# Patient Record
Sex: Female | Born: 1970 | Race: Black or African American | Hispanic: No | Marital: Married | Smoking: Former smoker
Health system: Southern US, Community
[De-identification: ages and names within clinical notes are randomized; demographics above are authoritative.]

## PROBLEM LIST (undated history)

## (undated) DIAGNOSIS — I10 Essential (primary) hypertension: Secondary | ICD-10-CM

## (undated) DIAGNOSIS — G43909 Migraine, unspecified, not intractable, without status migrainosus: Secondary | ICD-10-CM

## (undated) HISTORY — DX: Migraine, unspecified, not intractable, without status migrainosus: G43.909

## (undated) HISTORY — PX: TONSILLECTOMY: SUR1361

## (undated) HISTORY — PX: TUBAL LIGATION: SHX77

---

## 2000-03-04 ENCOUNTER — Emergency Department (HOSPITAL_COMMUNITY): Admission: EM | Admit: 2000-03-04 | Discharge: 2000-03-04 | Payer: Self-pay | Admitting: Emergency Medicine

## 2003-04-22 ENCOUNTER — Emergency Department (HOSPITAL_COMMUNITY): Admission: EM | Admit: 2003-04-22 | Discharge: 2003-04-22 | Payer: Self-pay | Admitting: Emergency Medicine

## 2003-05-28 ENCOUNTER — Ambulatory Visit (HOSPITAL_COMMUNITY): Admission: RE | Admit: 2003-05-28 | Discharge: 2003-05-28 | Payer: Self-pay | Admitting: Obstetrics & Gynecology

## 2003-06-13 ENCOUNTER — Emergency Department (HOSPITAL_COMMUNITY): Admission: EM | Admit: 2003-06-13 | Discharge: 2003-06-14 | Payer: Self-pay | Admitting: Podiatry

## 2003-07-17 ENCOUNTER — Ambulatory Visit (HOSPITAL_COMMUNITY): Admission: RE | Admit: 2003-07-17 | Discharge: 2003-07-17 | Payer: Self-pay | Admitting: Obstetrics & Gynecology

## 2003-09-26 ENCOUNTER — Ambulatory Visit (HOSPITAL_COMMUNITY): Admission: RE | Admit: 2003-09-26 | Discharge: 2003-09-26 | Payer: Self-pay | Admitting: Obstetrics & Gynecology

## 2003-10-01 ENCOUNTER — Inpatient Hospital Stay (HOSPITAL_COMMUNITY): Admission: RE | Admit: 2003-10-01 | Discharge: 2003-10-06 | Payer: Self-pay | Admitting: Obstetrics

## 2003-10-02 ENCOUNTER — Encounter (INDEPENDENT_AMBULATORY_CARE_PROVIDER_SITE_OTHER): Payer: Self-pay | Admitting: Specialist

## 2004-10-09 ENCOUNTER — Emergency Department (HOSPITAL_COMMUNITY): Admission: EM | Admit: 2004-10-09 | Discharge: 2004-10-09 | Payer: Self-pay | Admitting: Family Medicine

## 2005-07-17 ENCOUNTER — Emergency Department (HOSPITAL_COMMUNITY): Admission: EM | Admit: 2005-07-17 | Discharge: 2005-07-17 | Payer: Self-pay | Admitting: Emergency Medicine

## 2005-07-29 ENCOUNTER — Emergency Department (HOSPITAL_COMMUNITY): Admission: EM | Admit: 2005-07-29 | Discharge: 2005-07-29 | Payer: Self-pay | Admitting: Emergency Medicine

## 2005-10-24 ENCOUNTER — Emergency Department (HOSPITAL_COMMUNITY): Admission: EM | Admit: 2005-10-24 | Discharge: 2005-10-24 | Payer: Self-pay | Admitting: *Deleted

## 2006-05-23 ENCOUNTER — Emergency Department (HOSPITAL_COMMUNITY): Admission: EM | Admit: 2006-05-23 | Discharge: 2006-05-23 | Payer: Self-pay | Admitting: Emergency Medicine

## 2008-07-20 ENCOUNTER — Emergency Department (HOSPITAL_COMMUNITY): Admission: EM | Admit: 2008-07-20 | Discharge: 2008-07-20 | Payer: Self-pay | Admitting: Family Medicine

## 2008-08-24 ENCOUNTER — Ambulatory Visit: Payer: Self-pay | Admitting: Family Medicine

## 2008-08-24 LAB — CONVERTED CEMR LAB: Microalb, Ur: 4.4 mg/dL — ABNORMAL HIGH (ref 0.00–1.89)

## 2008-10-08 ENCOUNTER — Ambulatory Visit: Payer: Self-pay | Admitting: Family Medicine

## 2008-11-08 ENCOUNTER — Ambulatory Visit: Payer: Self-pay | Admitting: Family Medicine

## 2008-11-08 LAB — CONVERTED CEMR LAB
ALT: 15 units/L (ref 0–35)
AST: 23 units/L (ref 0–37)
Albumin: 4.1 g/dL (ref 3.5–5.2)
Alkaline Phosphatase: 45 units/L (ref 39–117)
BUN: 13 mg/dL (ref 6–23)
Basophils Absolute: 0 10*3/uL (ref 0.0–0.1)
Basophils Relative: 1 % (ref 0–1)
CO2: 25 meq/L (ref 19–32)
Calcium: 9.5 mg/dL (ref 8.4–10.5)
Chloride: 103 meq/L (ref 96–112)
Cholesterol: 153 mg/dL (ref 0–200)
Creatinine, Ser: 0.87 mg/dL (ref 0.40–1.20)
Eosinophils Absolute: 0.2 10*3/uL (ref 0.0–0.7)
Eosinophils Relative: 5 % (ref 0–5)
Glucose, Bld: 87 mg/dL (ref 70–99)
HCT: 36.3 % (ref 36.0–46.0)
HDL: 50 mg/dL (ref >39)
Hemoglobin: 11.7 g/dL — ABNORMAL LOW (ref 12.0–15.0)
LDL Cholesterol: 88 mg/dL (ref 0–99)
Lymphocytes Relative: 41 % (ref 12–46)
Lymphs Abs: 1.6 10*3/uL (ref 0.7–4.0)
MCHC: 32.2 g/dL (ref 30.0–36.0)
MCV: 88.8 fL (ref 78.0–100.0)
Monocytes Absolute: 0.5 10*3/uL (ref 0.1–1.0)
Monocytes Relative: 13 % — ABNORMAL HIGH (ref 3–12)
Neutro Abs: 1.6 10*3/uL — ABNORMAL LOW (ref 1.7–7.7)
Neutrophils Relative %: 40 % — ABNORMAL LOW (ref 43–77)
Platelets: 286 10*3/uL (ref 150–400)
Potassium: 3.8 meq/L (ref 3.5–5.3)
RBC: 4.09 M/uL (ref 3.87–5.11)
RDW: 14.4 % (ref 11.5–15.5)
Sodium: 138 meq/L (ref 135–145)
TSH: 1.252 microintl units/mL (ref 0.350–4.500)
Total Bilirubin: 0.3 mg/dL (ref 0.3–1.2)
Total CHOL/HDL Ratio: 3.1
Total Protein: 6.8 g/dL (ref 6.0–8.3)
Triglycerides: 76 mg/dL (ref <150)
VLDL: 15 mg/dL (ref 0–40)
WBC: 3.9 10*3/uL — ABNORMAL LOW (ref 4.0–10.5)

## 2008-11-23 ENCOUNTER — Ambulatory Visit: Payer: Self-pay | Admitting: Family Medicine

## 2008-11-23 ENCOUNTER — Encounter: Payer: Self-pay | Admitting: Family Medicine

## 2008-11-23 ENCOUNTER — Other Ambulatory Visit: Admission: RE | Admit: 2008-11-23 | Discharge: 2008-11-23 | Payer: Self-pay | Admitting: Family Medicine

## 2008-11-23 LAB — CONVERTED CEMR LAB
Chlamydia, DNA Probe: NEGATIVE
GC Probe Amp, Genital: NEGATIVE

## 2009-03-13 ENCOUNTER — Ambulatory Visit: Payer: Self-pay | Admitting: Internal Medicine

## 2009-09-13 ENCOUNTER — Ambulatory Visit: Payer: Self-pay | Admitting: Internal Medicine

## 2009-10-07 ENCOUNTER — Ambulatory Visit: Payer: Self-pay | Admitting: Internal Medicine

## 2009-10-07 LAB — CONVERTED CEMR LAB
BUN: 13 mg/dL (ref 6–23)
CO2: 23 meq/L (ref 19–32)
Calcium: 9 mg/dL (ref 8.4–10.5)
Chloride: 107 meq/L (ref 96–112)
Cholesterol: 148 mg/dL (ref 0–200)
Creatinine, Ser: 0.73 mg/dL (ref 0.40–1.20)
Glucose, Bld: 83 mg/dL (ref 70–99)
HDL: 65 mg/dL (ref >39)
LDL Cholesterol: 74 mg/dL (ref 0–99)
Potassium: 3.8 meq/L (ref 3.5–5.3)
Sodium: 140 meq/L (ref 135–145)
Total CHOL/HDL Ratio: 2.3
Triglycerides: 47 mg/dL (ref <150)
VLDL: 9 mg/dL (ref 0–40)

## 2009-11-12 ENCOUNTER — Ambulatory Visit: Payer: Self-pay | Admitting: Internal Medicine

## 2009-11-13 ENCOUNTER — Ambulatory Visit: Payer: Self-pay | Admitting: Internal Medicine

## 2009-11-27 ENCOUNTER — Ambulatory Visit: Payer: Self-pay | Admitting: Internal Medicine

## 2009-11-27 LAB — CONVERTED CEMR LAB
BUN: 14 mg/dL (ref 6–23)
CO2: 22 meq/L (ref 19–32)
Calcium: 9.7 mg/dL (ref 8.4–10.5)
Chloride: 105 meq/L (ref 96–112)
Creatinine, Ser: 0.86 mg/dL (ref 0.40–1.20)
Glucose, Bld: 88 mg/dL (ref 70–99)
Potassium: 4 meq/L (ref 3.5–5.3)
Sodium: 139 meq/L (ref 135–145)

## 2010-03-13 ENCOUNTER — Emergency Department (HOSPITAL_COMMUNITY): Admission: EM | Admit: 2010-03-13 | Discharge: 2010-03-13 | Payer: Self-pay | Admitting: Emergency Medicine

## 2010-07-08 LAB — POCT I-STAT, CHEM 8
BUN: 10 mg/dL (ref 6–23)
Calcium, Ion: 1.11 mmol/L — ABNORMAL LOW (ref 1.12–1.32)
Chloride: 106 mEq/L (ref 96–112)
Creatinine, Ser: 0.8 mg/dL (ref 0.4–1.2)
Glucose, Bld: 79 mg/dL (ref 70–99)
HCT: 37 % (ref 36.0–46.0)
Hemoglobin: 12.6 g/dL (ref 12.0–15.0)
Potassium: 3.6 mEq/L (ref 3.5–5.1)
Sodium: 138 mEq/L (ref 135–145)
TCO2: 23 mmol/L (ref 0–100)

## 2010-07-08 LAB — HEMOCCULT GUIAC POC 1CARD (OFFICE): Fecal Occult Bld: NEGATIVE

## 2010-08-07 LAB — POCT I-STAT, CHEM 8
BUN: 11 mg/dL (ref 6–23)
Calcium, Ion: 1.23 mmol/L (ref 1.12–1.32)
Chloride: 102 mEq/L (ref 96–112)
Creatinine, Ser: 1 mg/dL (ref 0.4–1.2)
Glucose, Bld: 88 mg/dL (ref 70–99)
HCT: 41 % (ref 36.0–46.0)
Hemoglobin: 13.9 g/dL (ref 12.0–15.0)
Potassium: 3.6 mEq/L (ref 3.5–5.1)
Sodium: 140 mEq/L (ref 135–145)
TCO2: 29 mmol/L (ref 0–100)

## 2010-09-12 NOTE — Op Note (Signed)
Hampton, KAHLER                     ACCOUNT NO.:  [id number]   MEDICAL RECORD NO.:  [id number]                   PATIENT TYPE:  INP   LOCATION:  9140                                 FACILITY:  [facility]   PHYSICIAN:  Charles A. Clearance Coots, M.D.             DATE OF BIRTH:  January 22, 1971   DATE OF PROCEDURE:  10/03/2003  DATE OF DISCHARGE:                                 OPERATIVE REPORT   PREOPERATIVE DIAGNOSES:  1. Arrest of descent.  2. Persistent occiput posterior position.  3. Desires sterilization.   POSTOPERATIVE DIAGNOSES:  1. Arrest of descent.  2. Persistent occiput posterior position.  3. Desires sterilization.   PROCEDURES:  1. Primary low transverse cesarean section.  2. Bilateral partial salpingectomy (Pomeroy technique).   SURGEON:  Charles A. Clearance Coots, M.D.   ASSISTANT:  Rochele Pages, certified surgical technician.   ANESTHESIA:  Epidural.   ESTIMATED BLOOD LOSS:  1000 mL.   FLUIDS REPLACED:  2800 mL.   URINE OUTPUT:  275 mL.   COMPLICATIONS:  None.   Foley to gravity.   FINDINGS:  A viable female at 2330, Apgars of 9 at one minute, 9 at five  minutes.  Weight of 7 pounds 14 ounces.  Normal uterus, ovaries, and  fallopian tubes.   SPECIMENS:  Approximately 2 cm segments of right and left fallopian tube.   OPERATION:  The patient was brought to the operating room after satisfactory  epidural redosing.  The abdomen was prepped and draped in the usual sterile  fashion.  A Pfannenstiel skin incision was made with the scalpel that was  deepened down to the fascia with a scalpel.  The fascia was nicked in the  midline and the fascial incision was extended to the left and to the right.  The superior and inferior fascial edges were taken off of the rectus muscle  with both blunt and sharp dissection.  The rectus muscle was sharply divided  in the midline superiorly and inferiorly.  The peritoneum was entered  digitally and was digitally extended to the left,  then to the right.  The  bladder blade was positioned.  The vesicouterine fold of peritoneum above  the reflection of the urinary bladder was grasped with a forceps and was  incised and undermined with Metzenbaum scissors.  The incision was extended  to the left and to the right with the Metzenbaum scissors.  The bladder flap  was bluntly developed and the bladder blade was repositioned in front of the  urinary bladder, placing it well out of the operative field.  The uterus was  then entered transversely with a scalpel in the lower uterine segment.  Clear fluid was expelled.  The uterine incision was then extended to the  left and to the right digitally.  The vertex was noted to be left occiput  posterior.  The occiput was rotated into the incision and the vertex was  then delivered with the aid of  fundal pressure from the assistant.  The  infant's mouth and nose were suctioned with a suction bulb and the delivery  was completed with the aid of fundal pressure from the assistant.  The  umbilical cord was doubly clamped and cut and the infant was handed off to  the nursery staff.  The placenta was spontaneously expelled from the uterine  cavity intact after cord blood was obtained.  The uterus was exteriorized  and the endometrial surface was thoroughly debrided with a dry lap sponge.  The edges of the uterine incision were grasped with ring forceps and the  uterus was closed with a continuous interlocking suture of 0 Monocryl from  each corner to the center.  Hemostasis was excellent.  Attention was then  turned above to the tubal ligation procedure.  The left fallopian tube was  grasped with the Babcock clamp in the isthmic area of the tube.  The tube  was identified from the cornual end to the fimbrial end and grasped with the  Babcock clamp.  The knuckle of tube beneath the Babcock clamp was then  doubly ligated with #1 plain catgut and the section of the tube above the  knot was  excised with Metzenbaum scissors and submitted to pathology for  evaluation.  The same procedure was performed on the opposite side without  complications.  The uterus was then placed back in its normal anatomic  position.  The pelvic cavity was thoroughly irrigated with warm saline  solution.  Abdomen was then closed as follows:  Peritoneum grasped with  Kelly forceps and was closed with a continuous suture of 2-0 Monocryl, the  fascia closed with continuous suture of 0 PDS from each corner to the  center, subcutaneous tissue was thoroughly irrigated with warm saline  solution.  All areas of subcutaneous bleeding were coagulated with the  Bovie.  The skin was then closed with a continuous subcuticular suture of 4-  0 Monocryl.  Sterile bandage was applied to the incision closure.  Surgical  technician indicated that all needle, sponge, and instrument counts were  correct.  The patient tolerated the procedure well and was transported to  the recovery room in satisfactory condition.                                               Charles A. Clearance Coots, M.D.    CAH/MEDQ  D:  10/03/2003  T:  10/03/2003  Job:  [id number]

## 2010-09-12 NOTE — Discharge Summary (Signed)
Jane Hampton, Jane Hampton                     ACCOUNT NO.:  [id number]   MEDICAL RECORD NO.:  [id number]                   PATIENT TYPE:  INP   LOCATION:  9140                                 FACILITY:  [facility]   PHYSICIAN:  Charles A. Clearance Coots, M.D.             DATE OF BIRTH:  1970/11/28   DATE OF ADMISSION:  10/01/2003  DATE OF DISCHARGE:  10/06/2003                                 DISCHARGE SUMMARY   ADMITTING DIAGNOSES:  1. Postdates pregnancy.  2. Borderline oligohydramnios.  3. Induction of labor.  4. Desired permanent sterilization.   DISCHARGE DIAGNOSES:  1. Postdates pregnancy.  2. Borderline oligohydramnios.  3. Induction of labor.  4. Desired permanent sterilization.  5. Status post primary low transverse cesarean section and bilateral partial     salpingectomy on October 03, 2003 for arrest of descent and persistent     occiput posterior position.  Delivered a viable female at 2330; Apgars of 9     at one minute and 9 at five minutes; a weight of 7 pounds 14 ounces,     length of 51 cm. Mother and infant discharged home in good condition.   REASON FOR ADMISSION:  A 40 year old female G3 P2 with estimated date of  confinement of Sep 25, 2003 seen in the office with a nonreactive nonstress  test.  Sent to [facility] for biophysical profile which revealed an  ultrasound biophysical profile of 8/8 but the amniotic fluid index was 7.9  cm.  The patient was therefore admitted for induction of labor for postdates  pregnancy with borderline oligohydramnios and nonreactive nonstress test.   PAST MEDICAL HISTORY:  Surgery:  None.  Illnesses:  None.   MEDICATIONS:  Prenatal vitamins.   ALLERGIES:  No known drug allergies.   SOCIAL HISTORY:  Single.  Negative tobacco, alcohol, or recreational drug  use.   PHYSICAL EXAMINATION:  VITAL SIGNS:  Afebrile, vital signs are stable.  LUNGS:  Clear to auscultation bilaterally.  HEART:  Regular rate and rhythm.  ABDOMEN:  Gravid,  nontender.  PELVIC:  Cervix long, closed, and vertex at a -3 station.   ADMITTING LABORATORY VALUES:  Hemoglobin 11.5; hematocrit 33.9; white blood  cell count 7700; platelets 214,000.   HOSPITAL COURSE:  The patient was admitted and started on Pitocin induction  of labor.  The patient progressed to 2 cm, 90% effacement, and the vertex  was at -1 station after starting the Pitocin the following morning after  cervical ripening the night before.  The patient progressed to 4-5 cm  dilatation, 100% effacement, and the vertex was at a -1 station after about  6 hours of Pitocin.  Membranes were ruptured at that point and intrauterine  pressure catheter and internal fetal scalp electrode were applied.  The  patient then progressed over the next 4 hours to 6 cm dilatation, 100%  effacement, and the vertex was at a -1 station with increased molding and  the  vertex was thought to be occiput posterior.  She made no change in her  cervical exam over the next 3 hours and after greater than 3 hours of no  cervical change and no descent of the vertex with adequate forces of labor  as indicated by the intrauterine pressure catheter [place] units a  decision was made to proceed for cesarean section delivery for arrest of the  first stage of labor; specifically, arrest of descent and persistent occiput  posterior position.  The patient desired permanent sterilization and this  was agreed to.  She underwent a primary low transverse cesarean section and  bilateral partial salpingectomy on October 03, 2003 and delivered a viable female  at 2330.  There were no intraoperative complications.  The postoperative  course was uncomplicated.  The patient was discharged home on postoperative  day #3 in good condition.   DISCHARGE LABORATORY VALUES:  Hemoglobin 10.9, hematocrit 31.9.   DISCHARGE DISPOSITION:  1. Medications:  Tylox and ibuprofen were prescribed for pain; the patient     is to continue prenatal  vitamins.  2. Routine written instructions per booklet were given for obstetrical     discharge post cesarean section.  3. The patient is to follow up in the office in 6 weeks.                                               Charles A. Clearance Coots, M.D.    CAH/MEDQ  D:  11/01/2003  T:  11/02/2003  Job:  [id number]

## 2011-01-20 ENCOUNTER — Emergency Department (HOSPITAL_COMMUNITY)
Admission: EM | Admit: 2011-01-20 | Discharge: 2011-01-21 | Disposition: A | Discharge status: Home / Self Care | Payer: Medicaid Other | Attending: Emergency Medicine | Admitting: Emergency Medicine

## 2011-01-20 DIAGNOSIS — I10 Essential (primary) hypertension: Secondary | ICD-10-CM | POA: Insufficient documentation

## 2011-01-20 DIAGNOSIS — IMO0002 Reserved for concepts with insufficient information to code with codable children: Secondary | ICD-10-CM | POA: Insufficient documentation

## 2011-05-18 ENCOUNTER — Emergency Department (HOSPITAL_COMMUNITY)
Admission: EM | Admit: 2011-05-18 | Discharge: 2011-05-19 | Disposition: A | Discharge status: Home / Self Care | Payer: Medicaid Other | Attending: Emergency Medicine | Admitting: Emergency Medicine

## 2011-05-18 ENCOUNTER — Encounter (HOSPITAL_COMMUNITY): Payer: Self-pay | Admitting: Emergency Medicine

## 2011-05-18 DIAGNOSIS — N898 Other specified noninflammatory disorders of vagina: Secondary | ICD-10-CM | POA: Insufficient documentation

## 2011-05-18 DIAGNOSIS — R109 Unspecified abdominal pain: Secondary | ICD-10-CM | POA: Insufficient documentation

## 2011-05-18 DIAGNOSIS — I10 Essential (primary) hypertension: Secondary | ICD-10-CM | POA: Insufficient documentation

## 2011-05-18 DIAGNOSIS — R319 Hematuria, unspecified: Secondary | ICD-10-CM | POA: Insufficient documentation

## 2011-05-18 DIAGNOSIS — Z79899 Other long term (current) drug therapy: Secondary | ICD-10-CM | POA: Insufficient documentation

## 2011-05-18 DIAGNOSIS — N939 Abnormal uterine and vaginal bleeding, unspecified: Secondary | ICD-10-CM

## 2011-05-18 HISTORY — DX: Essential (primary) hypertension: I10

## 2011-05-18 LAB — URINE MICROSCOPIC-ADD ON

## 2011-05-18 LAB — URINE CULTURE
Colony Count: 15000
Culture  Setup Time: 201301220024

## 2011-05-18 LAB — CBC
HCT: 33.8 % — ABNORMAL LOW (ref 36.0–46.0)
Hemoglobin: 11.6 g/dL — ABNORMAL LOW (ref 12.0–15.0)
MCH: 28.2 pg (ref 26.0–34.0)
MCHC: 34.3 g/dL (ref 30.0–36.0)
MCV: 82 fL (ref 78.0–100.0)
Platelets: 287 10*3/uL (ref 150–400)
RBC: 4.12 MIL/uL (ref 3.87–5.11)
RDW: 14.2 % (ref 11.5–15.5)
WBC: 7.3 10*3/uL (ref 4.0–10.5)

## 2011-05-18 LAB — URINALYSIS, ROUTINE W REFLEX MICROSCOPIC
Ketones, ur: NEGATIVE mg/dL
Leukocytes, UA: NEGATIVE
Nitrite: NEGATIVE
Protein, ur: NEGATIVE mg/dL
Specific Gravity, Urine: 1.012 (ref 1.005–1.030)
Urobilinogen, UA: 0.2 mg/dL (ref 0.0–1.0)

## 2011-05-18 LAB — DIFFERENTIAL
Eosinophils Absolute: 0.3 10*3/uL (ref 0.0–0.7)
Lymphs Abs: 2.6 10*3/uL (ref 0.7–4.0)
Monocytes Relative: 9 % (ref 3–12)
Neutrophils Relative %: 49 % (ref 43–77)

## 2011-05-18 MED ORDER — ACETAMINOPHEN 325 MG PO TABS
650.0000 mg | ORAL_TABLET | Freq: Once | ORAL | Status: AC
  Administered 2011-05-18: 650 mg via ORAL
  Filled 2011-05-18: qty 2

## 2011-05-18 NOTE — ED Notes (Signed)
Pt c/o lower abd and lower back pain for approx 4 days.  Also c/o hematuria and vag. discharge

## 2011-05-18 NOTE — ED Provider Notes (Signed)
History     CSN: [id number]  Arrival date & time 05/18/11  1843   First MD Initiated Contact with Patient 05/18/11 2305      Chief Complaint  Patient presents with  . Abdominal Pain    (Consider location/radiation/quality/duration/timing/severity/associated sxs/prior treatment) Patient is a 41 y.o. female presenting with abdominal pain. The history is provided by the patient.  Abdominal Pain The primary symptoms of the illness include abdominal pain and vaginal discharge. The primary symptoms of the illness do not include fever, fatigue, shortness of breath, nausea, vomiting, diarrhea or vaginal bleeding. The current episode started more than 2 days ago. The onset of the illness was gradual. The problem has been gradually worsening.  The patient states that she believes she is currently not pregnant. The patient has not had a change in bowel habit. Additional symptoms associated with the illness include hematuria. Symptoms associated with the illness do not include chills, anorexia, heartburn, constipation, urgency or frequency.   Pt states that she began to have lower abd discomfort on Thursday. This is crampy and intermittent in nature. No known aggravating/alleviating factors. Denies nausea, vomiting, change in BM. Has not had any change in appetite. States she also noticed vaginal discharge several days ago which was clear. Denies urinary sx, dyspareunia. Has not noted any vaginal bleeding. LMP 04/28/11; normally has very regular menses approx every 28-30 days. She has also noted what she believes to be hematuria. This was initially just a small amount of blood but has increased, and she has now seen several clots. Has not noted bleeding in between urination.  Past Medical History  Diagnosis Date  . Hypertension     Past Surgical History  Procedure Date  . Tubal ligation     History reviewed. No pertinent family history.  History  Substance Use Topics  . Smoking status: Never  Smoker   . Smokeless tobacco: Not on file  . Alcohol Use: No    OB History    Grav Para Term Preterm Abortions TAB SAB Ect Mult Living                  Review of Systems  Constitutional: Negative for fever, chills, appetite change, fatigue and unexpected weight change.  Respiratory: Negative for shortness of breath.   Cardiovascular: Negative for chest pain.  Gastrointestinal: Positive for abdominal pain. Negative for heartburn, nausea, vomiting, diarrhea, constipation, blood in stool, abdominal distention, rectal pain and anorexia.  Genitourinary: Positive for hematuria and vaginal discharge. Negative for urgency, frequency and vaginal bleeding.  Musculoskeletal: Negative for myalgias.  Neurological: Negative for dizziness and weakness.  Hematological: Does not bruise/bleed easily.    Allergies  Review of patient's allergies indicates no known allergies.  Home Medications   Current Outpatient Rx  Name Route Sig Dispense Refill  . LISINOPRIL 10 MG PO TABS Oral Take 10 mg by mouth daily.      BP 188/92  Pulse 53  Temp(Src) 98.2 F (36.8 C) (Oral)  Resp 16  SpO2 98%  LMP 04/28/2011 BP elevated. Pt does have hx of HTN. She is asymptomatic.  Physical Exam  Nursing note and vitals reviewed. Constitutional: She appears well-developed and well-nourished. No distress.  HENT:  Head: Normocephalic and atraumatic.  Eyes: EOM are normal.  Neck: Normal range of motion.  Cardiovascular: Normal rate, regular rhythm and normal heart sounds.   Pulmonary/Chest: Effort normal and breath sounds normal.  Abdominal: Soft. Bowel sounds are normal. She exhibits no distension and no mass. There  is tenderness. There is no rebound and no guarding.       Mildly tender to palpation across the lower abdomen  Genitourinary: Vagina normal and uterus normal.       RN chaperone present during exam No lesions seen on external exam. Urethral opening appears nl. Blood noted in vaginal vault. Cervix  nonfriable appearing. Minimal tenderness on bimanual at midline and R side. No masses appreciated.  Musculoskeletal: She exhibits no edema.  Neurological: She is alert.  Skin: Skin is warm and dry. She is not diaphoretic.  Psychiatric: She has a normal mood and affect.    ED Course  Procedures (including critical care time)  Labs Reviewed  URINALYSIS, ROUTINE W REFLEX MICROSCOPIC - Abnormal; Notable for the following:    Hgb urine dipstick SMALL (*)    All other components within normal limits  WET PREP, GENITAL - Abnormal; Notable for the following:    Clue Cells, Wet Prep FEW (*)    WBC, Wet Prep HPF POC MODERATE (*)    All other components within normal limits  BASIC METABOLIC PANEL - Abnormal; Notable for the following:    Potassium 3.0 (*)    Glucose, Bld 113 (*)    All other components within normal limits  CBC - Abnormal; Notable for the following:    Hemoglobin 11.6 (*)    HCT 33.8 (*)    All other components within normal limits  URINE MICROSCOPIC-ADD ON  GC/CHLAMYDIA PROBE AMP, GENITAL  URINE CULTURE  DIFFERENTIAL  PREGNANCY, URINE  LAB REPORT - SCANNED   No results found.   1. Abdominal pain   2. Vaginal bleeding       MDM  Pt with several days of crampy lower abdominal pain. She had concern for hematuria as she noted blood with urination. On pelvic exam, she appears to have blood in the vaginal vault, so suspect that the bleeding she has noted is GYN in origin, although this is early for her normal menses. H/H stable, vitals stable. She does not have an elevation in her creatinine. Small heme on UA but again suspect this is due to GYN bleeding. Do not feel that imaging such as ultrasound is clinically necessary in the ED but may be appropriate in an outpatient setting. Pt does have a GYN with whom she recently established care. Encouraged her to make a follow up appointment with him for further eval/tx. Return precautions discussed.        [place], [place] 05/20/11 [phone]

## 2011-05-19 LAB — WET PREP, GENITAL
Trich, Wet Prep: NONE SEEN
Yeast Wet Prep HPF POC: NONE SEEN

## 2011-05-19 LAB — GC/CHLAMYDIA PROBE AMP, GENITAL
Chlamydia, DNA Probe: NEGATIVE
GC Probe Amp, Genital: NEGATIVE

## 2011-05-19 LAB — BASIC METABOLIC PANEL
BUN: 10 mg/dL (ref 6–23)
CO2: 26 mEq/L (ref 19–32)
Calcium: 9.4 mg/dL (ref 8.4–10.5)
GFR calc Af Amer: >90 mL/min (ref >90)
GFR calc non Af Amer: >90 mL/min (ref >90)
Glucose, Bld: 113 mg/dL — ABNORMAL HIGH (ref 70–99)
Potassium: 3 mEq/L — ABNORMAL LOW (ref 3.5–5.1)
Sodium: 139 mEq/L (ref 135–145)

## 2011-05-19 MED ORDER — POTASSIUM CHLORIDE CRYS ER 20 MEQ PO TBCR
40.0000 meq | EXTENDED_RELEASE_TABLET | Freq: Once | ORAL | Status: AC
  Administered 2011-05-19: 40 meq via ORAL
  Filled 2011-05-19: qty 2

## 2011-05-19 NOTE — ED Notes (Signed)
Patient states that she has been hurting in her lower abd area and around to her back, LMP the beginning of the month.  Stated she has noticed a clear vag discharge then it turned dark.  Also noticed blood in her urine

## 2011-05-23 NOTE — ED Provider Notes (Signed)
Medical screening examination/treatment/procedure(s) were performed by non-physician practitioner and as supervising physician I was immediately available for consultation/collaboration.  Othella Slappey L Ferlando Lia, MD 05/23/11 1549 

## 2011-06-03 ENCOUNTER — Emergency Department (HOSPITAL_COMMUNITY)
Admission: EM | Admit: 2011-06-03 | Discharge: 2011-06-03 | Disposition: A | Discharge status: Home / Self Care | Payer: Medicaid Other | Source: Home / Self Care | Attending: Emergency Medicine | Admitting: Emergency Medicine

## 2011-06-03 ENCOUNTER — Encounter (HOSPITAL_COMMUNITY): Payer: Self-pay

## 2011-06-03 DIAGNOSIS — L259 Unspecified contact dermatitis, unspecified cause: Secondary | ICD-10-CM

## 2011-06-03 MED ORDER — PREDNISONE 10 MG PO TABS: ORAL_TABLET | ORAL | Status: DC

## 2011-06-03 MED ORDER — HYDROCORTISONE 1 % EX CREA: TOPICAL_CREAM | CUTANEOUS | Status: AC

## 2011-06-03 NOTE — ED Notes (Signed)
C/o 1 month or so has been having outbreak on mouth, lips; no relief w OTC medications; NAD at present

## 2011-06-03 NOTE — ED Provider Notes (Signed)
Chief Complaint  Patient presents with  . Mouth Lesions    History of Present Illness:  The patient has had a three-week history of blistering, erythema, irritation, burning, itching of the upper and lower lips. She denies any discrete ulcerations. She's tried over-the-counter occasions without any improvement. She has no intraoral lesions, no sore throat, no adenopathy.  Review of Systems:  Other than noted above, the patient denies any of the following symptoms: Systemic:  No fever, chills, sweats, weight loss, or fatigue. ENT:  No nasal congestion, rhinorrhea, sore throat, swelling of lips, tongue or throat. Resp:  No cough, wheezing, or shortness of breath. Skin:  No rash, itching, nodules, or suspicious lesions.  PMFSH:  Past medical history, family history, social history, meds, and allergies were reviewed.  Physical Exam:   Vital signs:  BP 168/105  Pulse 73  Temp(Src) 99.2 F (37.3 C) (Oral)  Resp 20  SpO2 100%  LMP 04/28/2011 Gen:  Alert, oriented, in no distress. Skin:  She has erythema, swelling, cracking, and a maculopapular rash of both upper and lower lips. There were no intraoral lesions. Her skin was otherwise clear.  Assessment:   Diagnoses that have been ruled out:  None  Diagnoses that are still under consideration:  None  Final diagnoses:  Contact dermatitis    Plan:   1.  The following meds were prescribed:   New Prescriptions   HYDROCORTISONE CREAM 1 %    Apply to affected area 3 times daily   PREDNISONE (DELTASONE) 10 MG TABLET    Take 4 tabs daily for 4 days, 3 tabs daily for 4 days, 2 tabs daily for 4 days, then 1 tab daily for 4 days.  Take all tabs at one time with food and preferably in the morning except for the first dose.   2.  The patient was instructed in symptomatic care and handouts were given. 3.  The patient was told to return if becoming worse in any way, if no better in 3 or 4 days, and given some red flag symptoms that would indicate  earlier return.     Roque Lias, MD 06/03/11 2258

## 2012-08-03 ENCOUNTER — Ambulatory Visit: Payer: Medicaid Other | Admitting: Internal Medicine

## 2012-09-05 ENCOUNTER — Other Ambulatory Visit: Payer: Self-pay

## 2012-09-05 DIAGNOSIS — Z1231 Encounter for screening mammogram for malignant neoplasm of breast: Secondary | ICD-10-CM

## 2012-10-12 ENCOUNTER — Ambulatory Visit
Admission: RE | Admit: 2012-10-12 | Discharge: 2012-10-12 | Disposition: A | Discharge status: Home / Self Care | Payer: PRIVATE HEALTH INSURANCE | Source: Ambulatory Visit

## 2012-10-12 DIAGNOSIS — Z1231 Encounter for screening mammogram for malignant neoplasm of breast: Secondary | ICD-10-CM

## 2013-10-25 ENCOUNTER — Other Ambulatory Visit: Payer: Self-pay

## 2013-10-25 DIAGNOSIS — Z1231 Encounter for screening mammogram for malignant neoplasm of breast: Secondary | ICD-10-CM

## 2013-11-09 ENCOUNTER — Ambulatory Visit
Admission: RE | Admit: 2013-11-09 | Discharge: 2013-11-09 | Disposition: A | Discharge status: Home / Self Care | Payer: PRIVATE HEALTH INSURANCE | Source: Ambulatory Visit

## 2013-11-09 ENCOUNTER — Encounter (INDEPENDENT_AMBULATORY_CARE_PROVIDER_SITE_OTHER): Payer: Self-pay

## 2013-11-09 DIAGNOSIS — Z1231 Encounter for screening mammogram for malignant neoplasm of breast: Secondary | ICD-10-CM

## 2014-04-19 ENCOUNTER — Ambulatory Visit: Payer: PRIVATE HEALTH INSURANCE | Admitting: Internal Medicine

## 2014-04-24 ENCOUNTER — Encounter: Payer: Self-pay | Admitting: Internal Medicine

## 2014-04-24 ENCOUNTER — Ambulatory Visit (INDEPENDENT_AMBULATORY_CARE_PROVIDER_SITE_OTHER): Payer: PRIVATE HEALTH INSURANCE | Admitting: Internal Medicine

## 2014-04-24 ENCOUNTER — Other Ambulatory Visit (INDEPENDENT_AMBULATORY_CARE_PROVIDER_SITE_OTHER): Payer: PRIVATE HEALTH INSURANCE

## 2014-04-24 VITALS — BP 140/88 | HR 81 | Temp 98.1°F | Resp 18 | Ht 62.0 in | Wt 188.4 lb

## 2014-04-24 DIAGNOSIS — R1084 Generalized abdominal pain: Secondary | ICD-10-CM | POA: Diagnosis not present

## 2014-04-24 DIAGNOSIS — I10 Essential (primary) hypertension: Secondary | ICD-10-CM

## 2014-04-24 DIAGNOSIS — Z23 Encounter for immunization: Secondary | ICD-10-CM | POA: Diagnosis not present

## 2014-04-24 LAB — BASIC METABOLIC PANEL
BUN: 11 mg/dL (ref 6–23)
CHLORIDE: 104 meq/L (ref 96–112)
CO2: 29 mEq/L (ref 19–32)
Calcium: 9.3 mg/dL (ref 8.4–10.5)
Creatinine, Ser: 0.7 mg/dL (ref 0.4–1.2)
GFR: 115.99 mL/min (ref >60.00)
GLUCOSE: 75 mg/dL (ref 70–99)
POTASSIUM: 3.3 meq/L — AB (ref 3.5–5.1)
SODIUM: 138 meq/L (ref 135–145)

## 2014-04-24 LAB — LIPID PANEL
CHOL/HDL RATIO: 2
Cholesterol: 189 mg/dL (ref 0–200)
HDL: 78.5 mg/dL (ref >39.00)
LDL CALC: 101 mg/dL — AB (ref 0–99)
NONHDL: 110.5
TRIGLYCERIDES: 46 mg/dL (ref 0.0–149.0)
VLDL: 9.2 mg/dL (ref 0.0–40.0)

## 2014-04-24 NOTE — Progress Notes (Signed)
Pre visit review using our clinic review tool, if applicable. No additional management support is needed unless otherwise documented below in the visit note. 

## 2014-04-24 NOTE — Patient Instructions (Signed)
We will check your blood work today and then call you back with the results. Once we get the results we will call in your blood pressure medicine.   We have also ordered your ultrasound of the stomach and you should hear back about getting it scheduled this week or early next week.

## 2014-04-25 ENCOUNTER — Other Ambulatory Visit: Payer: Self-pay | Admitting: Internal Medicine

## 2014-04-25 ENCOUNTER — Other Ambulatory Visit: Payer: Self-pay | Admitting: Geriatric Medicine

## 2014-04-25 DIAGNOSIS — R109 Unspecified abdominal pain: Secondary | ICD-10-CM | POA: Insufficient documentation

## 2014-04-25 DIAGNOSIS — I10 Essential (primary) hypertension: Secondary | ICD-10-CM | POA: Insufficient documentation

## 2014-04-25 MED ORDER — LISINOPRIL 20 MG PO TABS: 20.0000 mg | ORAL_TABLET | Freq: Every day | ORAL | Status: DC

## 2014-04-25 NOTE — Assessment & Plan Note (Signed)
Overall mild and she has not tried anything for the pain. Check abdominal US due to patient concern. At times she describes pain as epigastric and would trial PPI if no findings on US.

## 2014-04-25 NOTE — Progress Notes (Signed)
   Subjective:    Patient ID: Jane Hampton, female    DOB: May 19, 1970, 43 y.o.   MRN: [id number]  HPI The patient is a 43 YO female who is coming in today to establish care. She has been having some crampy pains in her stomach for the last week. She notices that they come and go but no pattern to the pains. She does not think it is associated with food at all. She has had high blood pressure in the past and is off medication at this time because she has not been to a doctor for some time. She does try to exercise but is not always able to do that. 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 She denies chest pains, SOB, swelling of her legs.   Review of Systems  Constitutional: Positive for activity change. Negative for fever, appetite change and fatigue.       Exercising more  HENT: Negative.   Respiratory: Negative for cough, chest tightness, shortness of breath and wheezing.   Cardiovascular: Negative for chest pain, palpitations and leg swelling.  Gastrointestinal: Positive for abdominal pain. Negative for nausea, diarrhea, constipation, blood in stool and abdominal distention.  Musculoskeletal: Negative for joint swelling, arthralgias and gait problem.  Skin: Negative.   Neurological: Negative.   Psychiatric/Behavioral: Negative.       Objective:   Physical Exam  Constitutional: She is oriented to person, place, and time. She appears well-developed and well-nourished.  HENT:  Head: Normocephalic and atraumatic.  Eyes: EOM are normal.  Neck: Normal range of motion.  Cardiovascular: Normal rate and regular rhythm.   Pulmonary/Chest: Effort normal and breath sounds normal. No respiratory distress. She has no wheezes. She has no rales.  Abdominal: Soft. Bowel sounds are normal. She exhibits no distension. There is no tenderness. There is no rebound.  Musculoskeletal: Normal range of motion.  Neurological: She is alert and oriented to person, place, and time.  Coordination normal.  Skin: Skin is warm and dry.   Filed Vitals:   04/24/14 1349  BP: 140/88  Pulse: 81  Temp: 98.1 F (36.7 C)  TempSrc: Oral  Resp: 18  Height: 5\' 2"  (1.575 m)  Weight: 188 lb 6.4 oz (85.458 kg)  SpO2: 99%      Assessment & Plan:

## 2014-04-25 NOTE — Assessment & Plan Note (Signed)
Patient previously on 2 medications and now that she is having lifestyle changes with exercise may be able to start 1 medication back. Await BMP then start medication.

## 2014-04-30 ENCOUNTER — Other Ambulatory Visit: Payer: PRIVATE HEALTH INSURANCE

## 2014-08-27 ENCOUNTER — Other Ambulatory Visit: Payer: Self-pay | Admitting: Internal Medicine

## 2014-08-30 ENCOUNTER — Other Ambulatory Visit: Payer: Self-pay | Admitting: Internal Medicine

## 2014-10-18 ENCOUNTER — Ambulatory Visit: Payer: PRIVATE HEALTH INSURANCE | Admitting: Internal Medicine

## 2014-11-02 ENCOUNTER — Ambulatory Visit: Payer: PRIVATE HEALTH INSURANCE | Admitting: Internal Medicine

## 2014-11-05 ENCOUNTER — Encounter: Payer: Self-pay | Admitting: Internal Medicine

## 2014-11-05 ENCOUNTER — Other Ambulatory Visit (INDEPENDENT_AMBULATORY_CARE_PROVIDER_SITE_OTHER): Payer: PRIVATE HEALTH INSURANCE

## 2014-11-05 ENCOUNTER — Ambulatory Visit (INDEPENDENT_AMBULATORY_CARE_PROVIDER_SITE_OTHER): Payer: PRIVATE HEALTH INSURANCE | Admitting: Internal Medicine

## 2014-11-05 VITALS — BP 190/89 | HR 63 | Temp 98.1°F | Resp 11 | Ht 62.0 in | Wt 190.0 lb

## 2014-11-05 DIAGNOSIS — K921 Melena: Secondary | ICD-10-CM

## 2014-11-05 DIAGNOSIS — K649 Unspecified hemorrhoids: Secondary | ICD-10-CM | POA: Diagnosis not present

## 2014-11-05 LAB — BASIC METABOLIC PANEL
BUN: 10 mg/dL (ref 6–23)
CALCIUM: 10 mg/dL (ref 8.4–10.5)
CO2: 26 mEq/L (ref 19–32)
Chloride: 104 mEq/L (ref 96–112)
Creatinine, Ser: 0.83 mg/dL (ref 0.40–1.20)
GFR: 96.61 mL/min (ref >60.00)
GLUCOSE: 73 mg/dL (ref 70–99)
POTASSIUM: 3.9 meq/L (ref 3.5–5.1)
Sodium: 136 mEq/L (ref 135–145)

## 2014-11-05 LAB — CBC
HCT: 25.5 % — ABNORMAL LOW (ref 36.0–46.0)
MCHC: 31.7 g/dL (ref 30.0–36.0)
MCV: 68.3 fl — ABNORMAL LOW (ref 78.0–100.0)
Platelets: 322 10*3/uL (ref 150.0–400.0)
RBC: 3.73 Mil/uL — ABNORMAL LOW (ref 3.87–5.11)
RDW: 16.5 % — ABNORMAL HIGH (ref 11.5–15.5)
WBC: 5.7 10*3/uL (ref 4.0–10.5)

## 2014-11-05 MED ORDER — HYDROCORTISONE ACETATE 25 MG RE SUPP: 25.0000 mg | Freq: Two times a day (BID) | RECTAL | Status: DC

## 2014-11-05 NOTE — Progress Notes (Signed)
Pre visit review using our clinic review tool, if applicable. No additional management support is needed unless otherwise documented below in the visit note. 

## 2014-11-05 NOTE — Patient Instructions (Signed)
We will send in a medicine for hemorrhoids that you use twice a day for 2 weeks. If the hemorrhoids are gone then stop using the medicine. If they are still giving you problems then keep using the medicine for another two weeks.   If you are not better in 1 month call us back.   We would like you to work on having more regular bowels as constipation can worsen trouble with hemorrhoids. A good medicine for keeping the stomach moving is called senokot-s which has two medicines in it: a stool softener and a medicine to help the bowels move better.

## 2014-11-06 DIAGNOSIS — K648 Other hemorrhoids: Secondary | ICD-10-CM | POA: Insufficient documentation

## 2014-11-06 DIAGNOSIS — K649 Unspecified hemorrhoids: Secondary | ICD-10-CM | POA: Insufficient documentation

## 2014-11-06 NOTE — Assessment & Plan Note (Signed)
Anusol suppositories prescribed for healing and shrinking. If still having problems after 1 month of therapy needs to see GI doctor. Talked to her about the importance of staying regular and recommended increasing fiber and water as well as senokot daily for regularity. She will think about it. Declined rectal exam today.

## 2014-11-06 NOTE — Progress Notes (Signed)
   Subjective:    Patient ID: Jane Hampton, female    DOB: 04/15/1971, 44 y.o.   MRN: [id number]  HPI The patient is a 44 YO female coming in for new problem of hemorrhoids. She has had them for years but they are bothering her lately. She struggles with constipation most of the time. Some straining recently. She is not taking anything for constipation at this time. Bright red blood when she wipes and coating some of the stools. Has not tried anything for the hemorrhoids over the counter. Has never sought care for them before. Some bloating and mild nausea if she is very constipated. Usually 2 days between bowel movements but lately has been a little worse.   Review of Systems  Constitutional: Negative for fever, activity change, appetite change, fatigue and unexpected weight change.  Respiratory: Negative.   Cardiovascular: Negative.   Gastrointestinal: Positive for constipation, blood in stool, abdominal distention and rectal pain. Negative for nausea, vomiting, abdominal pain and diarrhea.  Skin: Negative.       Objective:   Physical Exam  Constitutional: She appears well-developed and well-nourished.  HENT:  Head: Normocephalic and atraumatic.  Eyes: EOM are normal.  Neck: Normal range of motion.  Cardiovascular: Normal rate and regular rhythm.   Pulmonary/Chest: Effort normal.  Abdominal: Soft. Bowel sounds are normal. She exhibits no distension. There is no tenderness. There is no rebound.  Genitourinary:  Patient declined rectal exam  Skin: Skin is warm and dry.   Filed Vitals:   11/05/14 1346  BP: 190/89  Pulse: 63  Temp: 98.1 F (36.7 C)  TempSrc: Oral  Resp: 11  Height: 5\' 2"  (1.575 m)  Weight: 190 lb (86.183 kg)  SpO2: 97%      Assessment & Plan:

## 2014-11-08 ENCOUNTER — Telehealth: Payer: Self-pay | Admitting: Internal Medicine

## 2014-11-08 NOTE — Telephone Encounter (Signed)
Patient calling back regarding lab results. Read results, but she has questions. Please give her a call

## 2014-12-04 ENCOUNTER — Other Ambulatory Visit: Payer: Self-pay

## 2014-12-04 DIAGNOSIS — Z1231 Encounter for screening mammogram for malignant neoplasm of breast: Secondary | ICD-10-CM

## 2014-12-07 ENCOUNTER — Ambulatory Visit
Admission: RE | Admit: 2014-12-07 | Discharge: 2014-12-07 | Disposition: A | Discharge status: Home / Self Care | Payer: PRIVATE HEALTH INSURANCE | Source: Ambulatory Visit

## 2014-12-07 DIAGNOSIS — Z1231 Encounter for screening mammogram for malignant neoplasm of breast: Secondary | ICD-10-CM

## 2014-12-17 ENCOUNTER — Other Ambulatory Visit: Payer: Self-pay | Admitting: Internal Medicine

## 2014-12-17 DIAGNOSIS — R928 Other abnormal and inconclusive findings on diagnostic imaging of breast: Secondary | ICD-10-CM

## 2014-12-18 ENCOUNTER — Ambulatory Visit
Admission: RE | Admit: 2014-12-18 | Discharge: 2014-12-18 | Disposition: A | Discharge status: Home / Self Care | Payer: PRIVATE HEALTH INSURANCE | Source: Ambulatory Visit | Attending: Internal Medicine | Admitting: Internal Medicine

## 2014-12-18 DIAGNOSIS — R928 Other abnormal and inconclusive findings on diagnostic imaging of breast: Secondary | ICD-10-CM

## 2014-12-28 ENCOUNTER — Other Ambulatory Visit: Payer: Self-pay | Admitting: Internal Medicine

## 2015-02-23 ENCOUNTER — Other Ambulatory Visit: Payer: Self-pay | Admitting: Internal Medicine

## 2015-03-25 ENCOUNTER — Other Ambulatory Visit: Payer: Self-pay | Admitting: Internal Medicine

## 2015-04-22 ENCOUNTER — Other Ambulatory Visit: Payer: Self-pay | Admitting: Internal Medicine

## 2015-05-22 ENCOUNTER — Other Ambulatory Visit: Payer: Self-pay | Admitting: Internal Medicine

## 2015-06-19 ENCOUNTER — Other Ambulatory Visit: Payer: Self-pay | Admitting: Internal Medicine

## 2015-07-27 ENCOUNTER — Other Ambulatory Visit: Payer: Self-pay | Admitting: Internal Medicine

## 2015-09-06 ENCOUNTER — Encounter: Payer: Self-pay | Admitting: Internal Medicine

## 2015-09-06 ENCOUNTER — Ambulatory Visit (INDEPENDENT_AMBULATORY_CARE_PROVIDER_SITE_OTHER): Payer: BLUE CROSS/BLUE SHIELD | Admitting: Internal Medicine

## 2015-09-06 ENCOUNTER — Other Ambulatory Visit (INDEPENDENT_AMBULATORY_CARE_PROVIDER_SITE_OTHER): Payer: BLUE CROSS/BLUE SHIELD

## 2015-09-06 VITALS — BP 138/88 | HR 67 | Temp 98.1°F | Ht 62.0 in | Wt 189.5 lb

## 2015-09-06 DIAGNOSIS — K921 Melena: Secondary | ICD-10-CM

## 2015-09-06 DIAGNOSIS — I10 Essential (primary) hypertension: Secondary | ICD-10-CM

## 2015-09-06 DIAGNOSIS — N943 Premenstrual tension syndrome: Secondary | ICD-10-CM

## 2015-09-06 DIAGNOSIS — G43829 Menstrual migraine, not intractable, without status migrainosus: Secondary | ICD-10-CM

## 2015-09-06 DIAGNOSIS — F3281 Premenstrual dysphoric disorder: Secondary | ICD-10-CM | POA: Diagnosis not present

## 2015-09-06 DIAGNOSIS — N951 Menopausal and female climacteric states: Secondary | ICD-10-CM | POA: Insufficient documentation

## 2015-09-06 HISTORY — DX: Melena: K92.1

## 2015-09-06 LAB — IBC PANEL
Iron: 9 ug/dL — ABNORMAL LOW (ref 42–145)
Saturation Ratios: 2.1 % — ABNORMAL LOW (ref 20.0–50.0)
Transferrin: 310 mg/dL (ref 212.0–360.0)

## 2015-09-06 LAB — BASIC METABOLIC PANEL
BUN: 15 mg/dL (ref 6–23)
CO2: 27 mEq/L (ref 19–32)
CREATININE: 0.86 mg/dL (ref 0.40–1.20)
Calcium: 9.6 mg/dL (ref 8.4–10.5)
Chloride: 104 mEq/L (ref 96–112)
GFR: 92.37 mL/min (ref >60.00)
GLUCOSE: 88 mg/dL (ref 70–99)
Potassium: 3.4 mEq/L — ABNORMAL LOW (ref 3.5–5.1)
Sodium: 139 mEq/L (ref 135–145)

## 2015-09-06 LAB — URINALYSIS, ROUTINE W REFLEX MICROSCOPIC
Bilirubin Urine: NEGATIVE
Hgb urine dipstick: NEGATIVE
KETONES UR: NEGATIVE
Leukocytes, UA: NEGATIVE
Nitrite: NEGATIVE
PH: 5.5 (ref 5.0–8.0)
RBC / HPF: NONE SEEN (ref 0–?)
SPECIFIC GRAVITY, URINE: 1.025 (ref 1.000–1.030)
TOTAL PROTEIN, URINE-UPE24: NEGATIVE
URINE GLUCOSE: NEGATIVE
UROBILINOGEN UA: 0.2 (ref 0.0–1.0)

## 2015-09-06 LAB — HEPATIC FUNCTION PANEL
ALBUMIN: 4.2 g/dL (ref 3.5–5.2)
ALT: 20 U/L (ref 0–35)
AST: 25 U/L (ref 0–37)
Alkaline Phosphatase: 49 U/L (ref 39–117)
BILIRUBIN TOTAL: 0.3 mg/dL (ref 0.2–1.2)
Bilirubin, Direct: 0 mg/dL (ref 0.0–0.3)
Total Protein: 6.8 g/dL (ref 6.0–8.3)

## 2015-09-06 LAB — LIPID PANEL
CHOL/HDL RATIO: 3
CHOLESTEROL: 181 mg/dL (ref 0–200)
HDL: 69.6 mg/dL (ref >39.00)
LDL CALC: 100 mg/dL — AB (ref 0–99)
NonHDL: 111.38
TRIGLYCERIDES: 56 mg/dL (ref 0.0–149.0)
VLDL: 11.2 mg/dL (ref 0.0–40.0)

## 2015-09-06 LAB — TSH: TSH: 0.38 u[IU]/mL (ref 0.35–4.50)

## 2015-09-06 LAB — CBC WITH DIFFERENTIAL/PLATELET
BASOS PCT: 1.4 % (ref 0.0–3.0)
Basophils Absolute: 0.1 10*3/uL (ref 0.0–0.1)
EOS ABS: 0.2 10*3/uL (ref 0.0–0.7)
EOS PCT: 2.9 % (ref 0.0–5.0)
HEMATOCRIT: 31.1 % — AB (ref 36.0–46.0)
Hemoglobin: 10 g/dL — ABNORMAL LOW (ref 12.0–15.0)
LYMPHS PCT: 31.4 % (ref 12.0–46.0)
Lymphs Abs: 2.2 10*3/uL (ref 0.7–4.0)
MCHC: 32.2 g/dL (ref 30.0–36.0)
MCV: 73.8 fl — ABNORMAL LOW (ref 78.0–100.0)
MONO ABS: 0.7 10*3/uL (ref 0.1–1.0)
Monocytes Relative: 10.3 % (ref 3.0–12.0)
Neutro Abs: 3.7 10*3/uL (ref 1.4–7.7)
Neutrophils Relative %: 54 % (ref 43.0–77.0)
Platelets: 337 10*3/uL (ref 150.0–400.0)
RBC: 4.22 Mil/uL (ref 3.87–5.11)
RDW: 19.1 % — AB (ref 11.5–15.5)
WBC: 6.9 10*3/uL (ref 4.0–10.5)

## 2015-09-06 MED ORDER — FLUOXETINE HCL 10 MG PO CAPS: 10.0000 mg | ORAL_CAPSULE | Freq: Every day | ORAL | Status: DC

## 2015-09-06 MED ORDER — SUMATRIPTAN SUCCINATE 100 MG PO TABS: 100.0000 mg | ORAL_TABLET | ORAL | Status: DC | PRN

## 2015-09-06 MED ORDER — LISINOPRIL 20 MG PO TABS: ORAL_TABLET | ORAL | Status: DC

## 2015-09-06 NOTE — Progress Notes (Signed)
   Subjective:    Patient ID: Jane DonningJacqueline Linz, female    DOB: November 05, 1970, 45 y.o.   MRN: [id number]  HPI  Here to see me in f/u today as PCP is unavailable, and pt needs refill BP med. Here to f/u; overall doing ok,  Pt denies chest pain, increasing sob or doe, wheezing, orthopnea, PND, increased LE swelling, palpitations, dizziness or syncope.  Pt denies new neurological symptoms such as new headache, or facial or extremity weakness or numbness.  Pt denies polydipsia, polyuria, or low sugar episode.   Pt denies new neurological symptoms such as new headache, or facial or extremity weakness or numbness.   Pt states overall good compliance with meds, mostly trying to follow appropriate diet, with wt overall stable,  but little exercise however.   Wt Readings from Last 3 Encounters:  09/06/15 189 lb 8 oz (85.957 kg)  11/05/14 190 lb (86.183 kg)  04/24/14 188 lb 6.4 oz (85.458 kg)  Did have episode severe anemia July 2017 assoc with [facility], no recent f/u or colonoscopy evaluation.  Denies worsening reflux, abd pain, dysphagia, n/v, bowel change or blood.  Does also have moderate to severe premenstrual emotional lability, as well as menstrual migraine with typical migraine Day #1 every month, menses o/w somewhat heavy the first 2 days, then much less without HA after that Past Medical History  Diagnosis Date  . Hypertension    Past Surgical History  Procedure Laterality Date  . Tubal ligation      reports that she has never smoked. She does not have any smokeless tobacco history on file. She reports that she does not drink alcohol or use illicit drugs. family history includes Heart disease in her mother; Hypertension in her father and mother. No Known Allergies / Current Outpatient Prescriptions on File Prior to Visit  Medication Sig Dispense Refill  . lisinopril (PRINIVIL,ZESTRIL) 20 MG tablet TAKE 1 TABLET EVERY DAY (PT NEEDS OFFICE VISIT BEFORE ANY MORE REFILLS) 30 tablet 0   No current  facility-administered medications on file prior to visit.   Review of Systems  Constitutional: Negative for unusual diaphoresis or night sweats HENT: Negative for ear swelling or discharge Eyes: Negative for worsening visual haziness  Respiratory: Negative for choking and stridor.   Gastrointestinal: Negative for distension or worsening eructation Genitourinary: Negative for retention or change in urine volume.  Musculoskeletal: Negative for other MSK pain or swelling Skin: Negative for color change and worsening wound Neurological: Negative for tremors and numbness other than noted  Psychiatric/Behavioral: Negative for decreased concentration or agitation other than above       Objective:   Physical Exam BP 138/88 mmHg  Pulse 67  Temp(Src) 98.1 F (36.7 C) (Oral)  Ht 5\' 2"  (1.575 m)  Wt 189 lb 8 oz (85.957 kg)  BMI 34.65 kg/m2  SpO2 98% VS noted, not ill appearing Constitutional: Pt appears in no apparent distress HENT: Head: NCAT.  Right Ear: External ear normal.  Left Ear: External ear normal.  Eyes: . Pupils are equal, round, and reactive to light. Conjunctivae and EOM are normal Neck: Normal range of motion. Neck supple.  Cardiovascular: Normal rate and regular rhythm.   Pulmonary/Chest: Effort normal and breath sounds without rales or wheezing.  Abd:  Soft, NT, ND, + BS Neurological: Pt is alert. Not confused , motor grossly intact Skin: Skin is warm. No rash, no LE edema Psychiatric: Pt behavior is normal. No agitation. mild nervous    Assessment & Plan:

## 2015-09-06 NOTE — Patient Instructions (Signed)
Please take all new medication as prescribed - the imitrex for the headaches, as well as the prozac for the week prior to onset menses  Please continue all other medications as before, and refills have been done if requested - the bllood pressure medication  Please have the pharmacy call with any other refills you may need.  Please continue your efforts at being more active, low cholesterol diet, and weight control.  Please keep your appointments with your specialists as you may have planned  You will be contacted regarding the referral for: Gastroenterology  Please go to the LAB in the Basement (turn left off the elevator) for the tests to be done today  You will be contacted by phone if any changes need to be made immediately.  Otherwise, you will receive a letter about your results with an explanation, but please check with MyChart first.  Please remember to sign up for MyChart if you have not done so, as this will be important to you in the future with finding out test results, communicating by private email, and scheduling acute appointments online when needed.  Please return in 6 months, or sooner if needed, to Dr Okey Duprerawford

## 2015-09-06 NOTE — Assessment & Plan Note (Signed)
>>  ASSESSMENT AND PLAN FOR PMDD (PREMENSTRUAL DYSPHORIC DISORDER) WRITTEN ON 09/06/2015  2:38 PM BY NORLEEN LYNWOOD ORN, MD  With mod subjective symptoms, for prozac  10 mg dialy for 1 wk prior to menses every month,  to f/u any worsening symptoms or concerns

## 2015-09-06 NOTE — Assessment & Plan Note (Signed)
For imitrex asd,  to f/u any worsening symptoms or concerns  

## 2015-09-06 NOTE — Assessment & Plan Note (Signed)
stable overall by history and exam, recent data reviewed with pt, and pt to continue medical treatment as before,  to f/u any worsening symptoms or concerns BP Readings from Last 3 Encounters:  09/06/15 138/88  11/05/14 190/89  04/24/14 140/88   For labs today

## 2015-09-06 NOTE — Progress Notes (Signed)
Pre visit review using our clinic review tool, if applicable. No additional management support is needed unless otherwise documented below in the visit note. 

## 2015-09-06 NOTE — Assessment & Plan Note (Addendum)
None recent, but with severe anemia assoc in July 2017 without recent f/u; for f/u labs today, and refer GI as given age would have to be considered for r/o malignancy; suspect possibly diverticular by hx without recurrence but has had no prior colonscopy  Note:  Total time for pt hx, exam, review of record with pt in the room, determination of diagnoses and plan for further eval and tx is > 40 min, with over 50% spent in coordination and counseling of patient

## 2015-09-06 NOTE — Assessment & Plan Note (Signed)
With mod subjective symptoms, for prozac 10 mg dialy for 1 wk prior to menses every month,  to f/u any worsening symptoms or concerns

## 2015-10-03 ENCOUNTER — Encounter: Payer: Self-pay | Admitting: Gastroenterology

## 2015-10-03 ENCOUNTER — Ambulatory Visit (INDEPENDENT_AMBULATORY_CARE_PROVIDER_SITE_OTHER): Payer: BLUE CROSS/BLUE SHIELD | Admitting: Gastroenterology

## 2015-10-03 VITALS — BP 140/88 | HR 80 | Ht 63.75 in | Wt 188.4 lb

## 2015-10-03 DIAGNOSIS — K5900 Constipation, unspecified: Secondary | ICD-10-CM | POA: Diagnosis not present

## 2015-10-03 DIAGNOSIS — K625 Hemorrhage of anus and rectum: Secondary | ICD-10-CM | POA: Diagnosis not present

## 2015-10-03 NOTE — Patient Instructions (Signed)
You have been scheduled for a colonoscopy. Please follow written instructions given to you at your visit today.  Please pick up your prep supplies at the pharmacy within the next 1-3 days. If you use inhalers (even only as needed), please bring them with you on the day of your procedure. Your physician has requested that you go to www.startemmi.com and enter the access code given to you at your visit today. This web site gives a general overview about your procedure. However, you should still follow specific instructions given to you by our office regarding your preparation for the procedure.  If you are age 44 or older, your body mass index should be between 23-30. Your Body mass index is 32.6 kg/(m^2). If this is out of the aforementioned range listed, please consider follow up with your Primary Care Provider.  If you are age 44 or younger, your body mass index should be between 19-25. Your Body mass index is 32.6 kg/(m^2). If this is out of the aformentioned range listed, please consider follow up with your Primary Care Provider.   Thank you for choosing [facility]  Dr Amada JupiterHenry Danis III

## 2015-10-03 NOTE — Progress Notes (Signed)
[facility] Consult Note:  History: Jane DonningJacqueline Hampton 10/03/2015  Referring physician: Myrlene BrokerElizabeth A Crawford, MD  Reason for consult/chief complaint: Rectal Bleeding; Constipation; and Hemorrhoids   Subjective HPI:  Jane LankJacqueline is here today scribing over a year of intermittent painless rectal bleeding. It seems to occur with spicy foods. She tends toward constipation for many years and has used over-the-counter laxatives to improve her typical every other day bowel movement that has associated bloating and requires straining for stools. She denies abdominal pain her appetite is good and her weight stable. She denies heartburn dysphagia or odynophagia.  ROS:  Review of Systems  Constitutional: Negative for appetite change and unexpected weight change.  HENT: Negative for mouth sores and voice change.   Eyes: Negative for pain and redness.  Respiratory: Negative for cough and shortness of breath.   Cardiovascular: Negative for chest pain and palpitations.  Genitourinary: Negative for dysuria and hematuria.  Musculoskeletal: Negative for myalgias and arthralgias.  Skin: Negative for pallor and rash.  Neurological: Negative for weakness and headaches.  Hematological: Negative for adenopathy.   she describes heavy menstrual bleeding Mood is stable on Prozac  Past Medical History: Past Medical History  Diagnosis Date  . Hypertension   . Migraines      Past Surgical History: Past Surgical History  Procedure Laterality Date  . Tubal ligation    . Tonsillectomy    . Cesarean section       Family History: Family History  Problem Relation Age of Onset  . Hypertension Mother   . Heart disease Mother   . Hypertension Father   . Diabetes Maternal Grandmother   . Diabetes Mother   . Anuerysm Father     brain  . Anuerysm Sister     brain    Social History: Social History   Social History  . Marital Status: Married    Spouse Name: N/A  . Number of  Children: 3  . Years of Education: N/A   Occupational History  . certified nursing assistant    Social History Main Topics  . Smoking status: Former Smoker -- 15 years    Types: Cigarettes    Quit date: 10/02/2000  . Smokeless tobacco: Never Used  . Alcohol Use: No  . Drug Use: No  . Sexual Activity: Yes    Birth Control/ Protection: Surgical   Other Topics Concern  . None   Social History Narrative    Allergies: No Known Allergies  Outpatient Meds: Current Outpatient Prescriptions  Medication Sig Dispense Refill  . FLUoxetine (PROZAC) 10 MG capsule Take 1 capsule (10 mg total) by mouth daily. For 1 wk prior to onset menses every month 21 capsule 3  . lisinopril (PRINIVIL,ZESTRIL) 20 MG tablet TAKE 1 TABLET EVERY DAY (PT NEEDS OFFICE VISIT BEFORE ANY MORE REFILLS) 90 tablet 3  . SUMAtriptan (IMITREX) 100 MG tablet Take 1 tablet (100 mg total) by mouth every 2 (two) hours as needed for migraine or headache. May repeat in 2 hours if headache persists or recurs. 10 tablet 0   No current facility-administered medications for this visit.      ___________________________________________________________________ Objective  Exam:  BP 140/88 mmHg  Pulse 80  Ht 5' 3.75" (1.619 m)  Wt 188 lb 6 oz (85.446 kg)  BMI 32.60 kg/m2   General: this is a(n) Well-appearing woman with good muscle mass   Eyes: sclera anicteric, no redness  ENT: oral mucosa moist without lesions, no cervical or supraclavicular lymphadenopathy, good dentition  CV:  RRR without murmur, S1/S2, no JVD, no peripheral edema  Resp: clear to auscultation bilaterally, normal RR and effort noted  GI: soft, no tenderness, with active bowel sounds. No guarding or palpable organomegaly noted.  Skin; warm and dry, no rash or jaundice noted  Neuro: awake, alert and oriented x 3. Normal gross motor function and fluent speech Rectal exam was deferred Labs:  CBC    Component Value Date/Time   WBC 6.9  09/06/2015 1453   RBC 4.22 09/06/2015 1453   HGB 10.0* 09/06/2015 1453   HCT 31.1* 09/06/2015 1453   PLT 337.0 09/06/2015 1453   MCV 73.8* 09/06/2015 1453   MCH 28.2 05/18/2011 2327   MCHC 32.2 09/06/2015 1453   RDW 19.1* 09/06/2015 1453   LYMPHSABS 2.2 09/06/2015 1453   MONOABS 0.7 09/06/2015 1453   EOSABS 0.2 09/06/2015 1453   BASOSABS 0.1 09/06/2015 1453    Of note, she had microcytic anemia with hemoglobin of 8.1 in July 2016.   Assessment: Encounter Diagnoses  Name Primary?  . Rectal bleeding Yes  . Constipation, unspecified constipation type    She most likely has hemorrhoidal bleeding related to constipation Her anemia appears due to menorrhagia  Plan:  Colonoscopy to rule out neoplasia.  The benefits and risks of the planned procedure were described in detail with the patient or (when appropriate) their health care proxy.  Risks were outlined as including, but not limited to, bleeding, infection, perforation, adverse medication reaction leading to cardiac or pulmonary decompensation, or pancreatitis (if ERCP).  The limitation of incomplete mucosal visualization was also discussed.  No guarantees or warranties were given.   Thank you for the courtesy of this consult.  Please call me with any questions or concerns.  Charlie Pitter III  CC: Myrlene Broker, MD

## 2015-10-04 ENCOUNTER — Encounter: Payer: Self-pay | Admitting: Gastroenterology

## 2015-10-14 ENCOUNTER — Ambulatory Visit (AMBULATORY_SURGERY_CENTER): Payer: BLUE CROSS/BLUE SHIELD | Admitting: Gastroenterology

## 2015-10-14 ENCOUNTER — Encounter: Payer: Self-pay | Admitting: Gastroenterology

## 2015-10-14 VITALS — BP 163/87 | HR 58 | Temp 98.6°F | Resp 16 | Ht 63.75 in | Wt 188.0 lb

## 2015-10-14 DIAGNOSIS — K625 Hemorrhage of anus and rectum: Secondary | ICD-10-CM | POA: Diagnosis not present

## 2015-10-14 MED ORDER — SODIUM CHLORIDE 0.9 % IV SOLN: 500.0000 mL | INTRAVENOUS | Status: DC

## 2015-10-14 NOTE — Patient Instructions (Addendum)
HANDOUTS GIVEN:hEMORROIDS, HIGH FIBER DIET, [facility] AND CONSTIPATION.  TRY : STOOL SOFTNER  : DOCUSATE SODIUM(COLACE)  MIRALX 1 CAPFUL ONCE DAILY.                                         YOU HAD AN ENDOSCOPIC PROCEDURE TODAY AT THE [facility]:   Refer to the procedure report that was given to you for any specific questions about what was found during the examination.  If the procedure report does not answer your questions, please call your gastroenterologist to clarify.  If you requested that your care partner not be given the details of your procedure findings, then the procedure report has been included in a sealed envelope for you to review at your convenience later.  YOU SHOULD EXPECT: Some feelings of bloating in the abdomen. Passage of more gas than usual.  Walking can help get rid of the air that was put into your GI tract during the procedure and reduce the bloating. If you had a lower endoscopy (such as a colonoscopy or flexible sigmoidoscopy) you may notice spotting of blood in your stool or on the toilet paper. If you underwent a bowel prep for your procedure, you may not have a normal bowel movement for a few days.  Please Note:  You might notice some irritation and congestion in your nose or some drainage.  This is from the oxygen used during your procedure.  There is no need for concern and it should clear up in a day or so.  SYMPTOMS TO REPORT IMMEDIATELY:   Following lower endoscopy (colonoscopy or flexible sigmoidoscopy):  Excessive amounts of blood in the stool  Significant tenderness or worsening of abdominal pains  Swelling of the abdomen that is new, acute  Fever of 100F or higher   For urgent or emergent issues, a gastroenterologist can be reached at any hour by calling [phone].   DIET: Your first meal following the procedure should be a small meal and then it is ok to progress to your normal diet. Heavy or fried foods are harder to  digest and may make you feel nauseous or bloated.  Likewise, meals heavy in dairy and vegetables can increase bloating.  Drink plenty of fluids but you should avoid alcoholic beverages for 24 hours.  ACTIVITY:  You should plan to take it easy for the rest of today and you should NOT DRIVE or use heavy machinery until tomorrow (because of the sedation medicines used during the test).    FOLLOW UP: Our staff will call the number listed on your records the next business day following your procedure to check on you and address any questions or concerns that you may have regarding the information given to you following your procedure. If we do not reach you, we will leave a message.  However, if you are feeling well and you are not experiencing any problems, there is no need to return our call.  We will assume that you have returned to your regular daily activities without incident.  If any biopsies were taken you will be contacted by phone or by letter within the next 1-3 weeks.  Please call us at [phone] if you have not heard about the biopsies in 3 weeks.    SIGNATURES/CONFIDENTIALITY: You and/or your care partner have signed paperwork which will be entered into your electronic medical  record.  These signatures attest to the fact that that the information above on your After Visit Summary has been reviewed and is understood.  Full responsibility of the confidentiality of this discharge information lies with you and/or your care-partner. 

## 2015-10-14 NOTE — Progress Notes (Signed)
Report given to PACU RN, vss 

## 2015-10-14 NOTE — Op Note (Addendum)
[facility] Patient Name: Jane Hampton Procedure Date: 10/14/2015 1:24 PM MRN: [id number] Endoscopist: Sherilyn Cooter L. Myrtie Neither , MD Age: 45 Referring MD:  Date of Birth: Aug 04, 1970 Gender: Female Account #: [id number] Procedure:                Colonoscopy Indications:              Rectal bleeding, Constipation Medicines:                Monitored Anesthesia Care Procedure:                Pre-Anesthesia Assessment:                           - Prior to the procedure, a History and Physical                            was performed, and patient medications and                            allergies were reviewed. The patient's tolerance of                            previous anesthesia was also reviewed. The risks                            and benefits of the procedure and the sedation                            options and risks were discussed with the patient.                            All questions were answered, and informed consent                            was obtained. Prior Anticoagulants: The patient has                            taken no previous anticoagulant or antiplatelet                            agents. ASA Grade Assessment: II - A patient with                            mild systemic disease. After reviewing the risks                            and benefits, the patient was deemed in                            satisfactory condition to undergo the procedure.                           After obtaining informed consent, the colonoscope  was passed under direct vision. Throughout the                            procedure, the patient's blood pressure, pulse, and                            oxygen saturations were monitored continuously. The                            Model CF-[id number](SN#[id number]) scope was introduced                            through the anus and advanced to the the cecum,                            identified by appendiceal  orifice and ileocecal                            valve. The colonoscopy was performed without                            difficulty. The patient tolerated the procedure                            well. The quality of the bowel preparation was                            excellent. The ileocecal valve, appendiceal                            orifice, and rectum were photographed. Scope In: 1:36:42 PM Scope Out: 1:48:04 PM Scope Withdrawal Time: 0 hours 8 minutes 52 seconds  Total Procedure Duration: 0 hours 11 minutes 22 seconds  Findings:                 The perianal and digital rectal examinations were                            normal.                           Internal hemorrhoids were found during                            retroflexion. The hemorrhoids were medium-sized and                            Grade I (internal hemorrhoids that do not prolapse).                           The exam was otherwise without abnormality. Complications:            No immediate complications. Estimated Blood Loss:     Estimated blood loss: none. Impression:               - Internal hemorrhoids.                           -  The examination was otherwise normal.                           - No specimens collected. Recommendation:           - Patient has a contact number available for                            emergencies. The signs and symptoms of potential                            delayed complications were discussed with the                            patient. Return to normal activities tomorrow.                            Written discharge instructions were provided to the                            patient.                           - Resume previous diet.                           - Continue present medications.                           - Repeat colonoscopy in 10 years for screening                            purposes.                           - Stool softener (ducosate sodium) 100 mg twice                             daily                           Miralax 1 capful once daily                           Schedule for hemorrhoidal banding. Mccoy Testa L. Myrtie Neither, MD 10/14/2015 2:01:37 PM This report has been signed electronically.

## 2015-10-15 ENCOUNTER — Telehealth: Payer: Self-pay

## 2015-10-15 NOTE — Telephone Encounter (Signed)
  Follow up Call-  Call back number 10/14/2015  Post procedure Call Back phone  # [phone]  Permission to leave phone message Yes     Patient questions:  Do you have a fever, pain , or abdominal swelling? No. Pain Score  0 *  Have you tolerated food without any problems? Yes.    Have you been able to return to your normal activities? Yes.    Do you have any questions about your discharge instructions: Diet   No. Medications  No. Follow up visit  No.  Do you have questions or concerns about your Care? No.  Actions: * If pain score is 4 or above: No action needed, pain <4.

## 2015-11-08 ENCOUNTER — Ambulatory Visit: Payer: BLUE CROSS/BLUE SHIELD | Admitting: Internal Medicine

## 2015-12-12 ENCOUNTER — Encounter: Payer: BLUE CROSS/BLUE SHIELD | Admitting: Internal Medicine

## 2016-01-03 LAB — CYTOLOGY - PAP

## 2016-01-10 ENCOUNTER — Encounter: Payer: Self-pay | Admitting: Nurse Practitioner

## 2016-01-10 ENCOUNTER — Ambulatory Visit (INDEPENDENT_AMBULATORY_CARE_PROVIDER_SITE_OTHER): Payer: BLUE CROSS/BLUE SHIELD | Admitting: Nurse Practitioner

## 2016-01-10 VITALS — BP 162/101 | HR 86 | Temp 98.7°F | Ht 62.0 in | Wt 194.0 lb

## 2016-01-10 DIAGNOSIS — I10 Essential (primary) hypertension: Secondary | ICD-10-CM

## 2016-01-10 DIAGNOSIS — Z23 Encounter for immunization: Secondary | ICD-10-CM | POA: Diagnosis not present

## 2016-01-10 MED ORDER — AMLODIPINE BESYLATE 5 MG PO TABS: 5.0000 mg | ORAL_TABLET | Freq: Every day | ORAL | 1 refills | Status: DC

## 2016-01-10 NOTE — Assessment & Plan Note (Signed)
Started amlodipine. F/up in 50month Repeat BMP in 50month.

## 2016-01-10 NOTE — Progress Notes (Signed)
Subjective:  Patient ID: Jane Hampton, female    DOB: [birth year]  Age: 45 y.o. MRN: [id number]  CC: Hypertension (very high BP since yesterday/denied headache)   Hypertension  This is a chronic problem. The current episode started in the past 7 days. The problem has been gradually worsening since onset. The problem is uncontrolled. Pertinent negatives include no anxiety, blurred vision, chest pain, headaches, malaise/fatigue, orthopnea, palpitations, peripheral edema, PND or shortness of breath. There are no associated agents to hypertension. Risk factors for coronary artery disease include dyslipidemia, family history and obesity. Past treatments include angiotensin blockers. There are no compliance problems.  There is no history of a thyroid problem. There is no history of a hypertension causing med or sleep apnea.   BP Readings from Last 3 Encounters:  01/10/16 (!) 162/101  10/14/15 (!) 163/87  10/03/15 140/88   Outpatient Medications Prior to Visit  Medication Sig Dispense Refill  . lisinopril (PRINIVIL,ZESTRIL) 20 MG tablet TAKE 1 TABLET EVERY DAY (PT NEEDS OFFICE VISIT BEFORE ANY MORE REFILLS) 90 tablet 3  . SUMAtriptan (IMITREX) 100 MG tablet Take 1 tablet (100 mg total) by mouth every 2 (two) hours as needed for migraine or headache. May repeat in 2 hours if headache persists or recurs. (Patient not taking: Reported on 01/10/2016) 10 tablet 0  . FLUoxetine (PROZAC) 10 MG capsule Take 1 capsule (10 mg total) by mouth daily. For 1 wk prior to onset menses every month (Patient not taking: Reported on 01/10/2016) 21 capsule 3   No facility-administered medications prior to visit.     ROS See HPI  Objective:  BP (!) 162/101 (BP Location: Left Arm, Patient Position: Sitting, Cuff Size: Normal)   Pulse 86   Temp 98.7 F (37.1 C)   Ht 5\' 2"  (1.575 m)   Wt 194 lb (88 kg)   SpO2 98%   BMI 35.48 kg/m   BP Readings from Last 3 Encounters:  01/10/16 (!) 162/101  10/14/15 (!)  163/87  10/03/15 140/88    Wt Readings from Last 3 Encounters:  01/10/16 194 lb (88 kg)  10/14/15 188 lb (85.3 kg)  10/03/15 188 lb 6 oz (85.4 kg)    Physical Exam  Constitutional: No distress.  HENT:  Right Ear: External ear normal.  Left Ear: External ear normal.  Nose: Nose normal.  Mouth/Throat: Oropharynx is clear and moist. No oropharyngeal exudate.  Eyes: Conjunctivae and EOM are normal. Pupils are equal, round, and reactive to light.  Neck: Normal range of motion. Neck supple.  Cardiovascular: Normal rate.   Murmur heard. Soft systolic murmur 2/6  Pulmonary/Chest: Effort normal and breath sounds normal.  Musculoskeletal: She exhibits no edema.  Neurological: She is alert.  Skin: Skin is warm and dry.  Vitals reviewed.   Lab Results  Component Value Date   WBC 6.9 09/06/2015   HGB 10.0 (L) 09/06/2015   HCT 31.1 (L) 09/06/2015   PLT 337.0 09/06/2015   GLUCOSE 88 09/06/2015   CHOL 181 09/06/2015   TRIG 56.0 09/06/2015   HDL 69.60 09/06/2015   LDLCALC 100 (H) 09/06/2015   ALT 20 09/06/2015   AST 25 09/06/2015   NA 139 09/06/2015   K 3.4 (L) 09/06/2015   CL 104 09/06/2015   CREATININE 0.86 09/06/2015   BUN 15 09/06/2015   CO2 27 09/06/2015   TSH 0.38 09/06/2015   MICROALBUR 4.40 (H) 08/24/2008    US Breast Ltd Uni Left Inc Axilla  Result Date: 12/18/2014 CLINICAL DATA:  Screening recall for an asymmetry seen in the central left breast on the CC view only. EXAM: DIGITAL DIAGNOSTIC LEFT MAMMOGRAM WITH 3D TOMOSYNTHESIS AND CAD LEFT BREAST ULTRASOUND COMPARISON:  Previous exam(s). ACR Breast Density Category c: The breast tissue is heterogeneously dense, which may obscure small masses. FINDINGS: Cc and MLO tomosynthesis was performed of the left breast. The initially questioned left breast asymmetry resolves on the additional imaging with findings compatible with overlapping tissue. There is an oval circumscribed mass seen in the central to slightly outer left  breast on the CC view measuring approximately 5 mm. Mammographic images were processed with CAD. Physical examination of the upper left breast is not reveal any palpable masses. Targeted ultrasound of the left breast was performed demonstrating several cysts and clusters of cysts in the central slightly upper as well as outer left breast. A cyst in the left breast at 12 o'clock 6 cm from the nipple measures 0.5 x 0.3 x 0.6 cm. This likely corresponds with mammography findings. A cluster of cysts in the left breast at 534 cm from the nipple measures 1.1 x 0.4 by 1 cm. IMPRESSION: Left breast cysts. There is no mammographic evidence of malignancy in the left breast. RECOMMENDATION: Screening mammogram in one year.(Code:SM-B-01Y) I have discussed the findings and recommendations with the patient. Results were also provided in writing at the conclusion of the visit. If applicable, a reminder letter will be sent to the patient regarding the next appointment. BI-RADS CATEGORY  2: Benign. Electronically Signed   By: Edwin Cap M.D.   On: 12/18/2014 15:09   Mm Diag Breast Tomo Uni Left  Result Date: 12/18/2014 CLINICAL DATA:  Screening recall for an asymmetry seen in the central left breast on the CC view only. EXAM: DIGITAL DIAGNOSTIC LEFT MAMMOGRAM WITH 3D TOMOSYNTHESIS AND CAD LEFT BREAST ULTRASOUND COMPARISON:  Previous exam(s). ACR Breast Density Category c: The breast tissue is heterogeneously dense, which may obscure small masses. FINDINGS: Cc and MLO tomosynthesis was performed of the left breast. The initially questioned left breast asymmetry resolves on the additional imaging with findings compatible with overlapping tissue. There is an oval circumscribed mass seen in the central to slightly outer left breast on the CC view measuring approximately 5 mm. Mammographic images were processed with CAD. Physical examination of the upper left breast is not reveal any palpable masses. Targeted ultrasound of the  left breast was performed demonstrating several cysts and clusters of cysts in the central slightly upper as well as outer left breast. A cyst in the left breast at 12 o'clock 6 cm from the nipple measures 0.5 x 0.3 x 0.6 cm. This likely corresponds with mammography findings. A cluster of cysts in the left breast at 534 cm from the nipple measures 1.1 x 0.4 by 1 cm. IMPRESSION: Left breast cysts. There is no mammographic evidence of malignancy in the left breast. RECOMMENDATION: Screening mammogram in one year.(Code:SM-B-01Y) I have discussed the findings and recommendations with the patient. Results were also provided in writing at the conclusion of the visit. If applicable, a reminder letter will be sent to the patient regarding the next appointment. BI-RADS CATEGORY  2: Benign. Electronically Signed   By: Edwin Cap M.D.   On: 12/18/2014 15:09    Assessment & Plan:   Reizy was seen today for hypertension.  Diagnoses and all orders for this visit:  Essential hypertension -     amLODipine (NORVASC) 5 MG tablet; Take 1 tablet (5 mg total) by mouth  at bedtime.   I have discontinued Ms. Quraishi's FLUoxetine. I am also having her start on amLODipine. Additionally, I am having her maintain her SUMAtriptan and lisinopril.  Meds ordered this encounter  Medications  . amLODipine (NORVASC) 5 MG tablet    Sig: Take 1 tablet (5 mg total) by mouth at bedtime.    Dispense:  90 tablet    Refill:  1    Order Specific Question:   Supervising Provider    Answer:   Tresa GarterPLOTNIKOV, ALEKSEI V [[id number]]    Follow-up: Return in about 1 month (around 02/09/2016) for HTN.  Alysia Pennaharlotte Maven Rosander, NP

## 2016-01-10 NOTE — Progress Notes (Signed)
Pre visit review using our clinic review tool, if applicable. No additional management support is needed unless otherwise documented below in the visit note. 

## 2016-01-10 NOTE — Patient Instructions (Signed)
Check BP once a day and record. Bring BP records to next office visit.  Hypertension Hypertension is another name for high blood pressure. High blood pressure forces your heart to work harder to pump blood. A blood pressure reading has two numbers, which includes a higher number over a lower number (example: 110/72). HOME CARE   Have your blood pressure rechecked by your doctor.  Only take medicine as told by your doctor. Follow the directions carefully. The medicine does not work as well if you skip doses. Skipping doses also puts you at risk for problems.  Do not smoke.  Monitor your blood pressure at home as told by your doctor. GET HELP IF:  You think you are having a reaction to the medicine you are taking.  You have repeat headaches or feel dizzy.  You have puffiness (swelling) in your ankles.  You have trouble with your vision. GET HELP RIGHT AWAY IF:   You get a very bad headache and are confused.  You feel weak, numb, or faint.  You get chest or belly (abdominal) pain.  You throw up (vomit).  You cannot breathe very well. MAKE SURE YOU:   Understand these instructions.  Will watch your condition.  Will get help right away if you are not doing well or get worse.   This information is not intended to replace advice given to you by your health care provider. Make sure you discuss any questions you have with your health care provider.   Document Released: 09/30/2007 Document Revised: 04/18/2013 Document Reviewed: 02/03/2013 Elsevier Interactive Patient Education [employer]2016 [employer] Inc.

## 2016-01-21 ENCOUNTER — Ambulatory Visit (INDEPENDENT_AMBULATORY_CARE_PROVIDER_SITE_OTHER): Payer: BLUE CROSS/BLUE SHIELD | Admitting: Gastroenterology

## 2016-01-21 ENCOUNTER — Encounter: Payer: Self-pay | Admitting: Gastroenterology

## 2016-01-21 VITALS — BP 152/96 | HR 80 | Ht 62.0 in | Wt 190.0 lb

## 2016-01-21 DIAGNOSIS — K602 Anal fissure, unspecified: Secondary | ICD-10-CM | POA: Diagnosis not present

## 2016-01-21 DIAGNOSIS — K5901 Slow transit constipation: Secondary | ICD-10-CM | POA: Diagnosis not present

## 2016-01-21 MED ORDER — AMBULATORY NON FORMULARY MEDICATION: 0 refills | Status: DC

## 2016-01-21 NOTE — Patient Instructions (Signed)
Patient Drug Education for Nitroglycerin Ointment  Nitroglycerin ointment (NTG) is used to help heal anal fissures. The ointment relaxes the smooth muscle around the anus and promotes blood flow which helps heal the fissure (tear). The NTG reduces anal canal pressure, which diminishes pain and spasm. We use a diluted concentration of NTG (.125%) compared to the 2% that is typically used for heart patients, and this is why you need to obtain the medication from a pharmacy which will compound your prescription.  The NTG ointment should be applied 3 times per day, or as directed.  A pea-sized drop should be placed on the tip of your finger and then gently placed inside the anus. The finger should be inserted 1/3 - 1/2 its length and may be covered with a plastic glove or finger cot. You may use Vaseline to help coat the finger or dilute the ointment. If you are advised to mix the NTG with steroid ointment, limit the steroids to one to two weeks.  The first few applications should be taken lying down, as mild light-headedness or a brief headache may occur.  It may take several weeks for the fissure to begin healing, and you will need to continue taking the medication after resolution of your symptoms.  It is important to continue the treatment for the entire time period - up to 3 months or as directed. It takes up to two years for the healing tissue to regain the normal skin strength. You will be advised to add fiber to your diet, increase water intake to 7-8 glasses per day, take relaxing baths or sitz baths, and avoid prolonged sitting and straining on the commode. Local anesthetic ointment may be added.  Initially, the anal fissure is very inflamed, which allows more of the NTG to get into the blood. This allows for a higher incidence of the most common side effect - a headache. It is usually brief and mild, but may require Tylenol or Advil. You may dilute the NTG further with Vaseline to decrease the  headaches. As the treatment progresses and the fissure begins to heal, the headaches will tend to dissipate. Other side effects include lightheadedness, flushing, dizziness, nervousness, nausea, and vomiting. If any of these side effects persist or worsen, notify us promptly. Stop using the NTG and notify us immediately if you develop the rare side effects of severe dizziness, fainting, fast/pounding heartbeat, paleness, sweating, blurred vision, dry mouth, dark urine, bluish lips/skin/nails, unusual tiredness, severe weakness, irregular heartbeat, seizures, or chest pain. Serious allergic reactions are unusual, but seek immediate medical attention if you develop a rash, swelling, dizziness, or trouble breathing.  Tell us if you are allergic to nitrates, have severe anemia, low blood pressure, dehydration, chronic heart failure, cardiomyopathy, recent heart attack, increased pressure in the brain, or exposure to nitrates while on the job. Do not use NTG while driving or working around machinery if you are drowsy, dizzy, have lightheadedness, or blurred vision. Limit alcoholic beverages. To minimize dizziness and lightheadedness, get up slowly when rising from a sitting or lying position. The elderly may be more prone to dizziness and falling. While there are not adequate studies to confirm the safety of NTG in pregnant or breast feeding women, it has been used without incident so far. We recommend waiting at least one hour after applying the NTG ointment before breast feeding.  Do not use NTG ointment if you are taking drugs for sexual problems [e.g., sildenafil (Viagra), tadalafil (Cialis), vardenafil (Levitra)]. Use caution  before taking cough-and-cold products, diet aids, or NSAIDs preparations because they may contain ingredients that could increase your blood pressure, cause a fast heartbeat, or increase chest pain (e.g., pseudoephedrine, phenylephrine, chlorpheniramine, diphenhydramine, clemastine,  ibuprofen, and naproxen). Tell us if you drink alcohol, take alteplase, migraine drugs (ergotamine), water pills/diuretics such as furosemide or hydrochlorothiazide, or other drugs for high blood pressure (beta blockers, calcium channel blockers, ACE inhibitors).  Store the NTG at room temperature and keep away from light and moisture. Close the container tightly after each use. Do not store in the bathroom. Keep away from children and pets. If you have any questions or problems please call us at [phone].  If you are age 44 or older, your body mass index should be between 23-30. Your Body mass index is 34.75 kg/m. If this is out of the aforementioned range listed, please consider follow up with your Primary Care Provider.  If you are age 44 or younger, your body mass index should be between 19-25. Your Body mass index is 34.75 kg/m. If this is out of the aformentioned range listed, please consider follow up with your Primary Care Provider.   Thank you for choosing [facility]  Dr Amada JupiterHenry Danis III

## 2016-01-21 NOTE — Progress Notes (Signed)
[facility] Progress Note  Chief Complaint: rectal pain and bleeding, constipation  Subjective  History:  Still tends to constipation , can have a BM every other day on combination of ducolax and miralax.  Still strains at times  ROS: Cardiovascular:  no chest pain Respiratory: no dyspnea  The patient's Past Medical, Family and Social History were reviewed and are on file in the EMR.  Objective:  Med list reviewed  Vital signs in last 24 hrs: Vitals:   01/21/16 0829  BP: (!) 152/96  Pulse: 80    Physical Exam   Rectal: tender posterior anal fissure    @ASSESSMENTPLANBEGIN @ Assessment: Encounter Diagnoses  Name Primary?  . Slow transit constipation Yes  . Anal fissure       Plan: Maintain current regimen for constipation.  NTG ointment three times daily, instructions given.  RTO 3 weeks for recheck/anoscopy and possible banding  Total time 15 minutes, over half spent in counseling and coordination of care.   Charlie PitterHenry L Danis III

## 2016-02-06 ENCOUNTER — Ambulatory Visit (INDEPENDENT_AMBULATORY_CARE_PROVIDER_SITE_OTHER): Payer: BLUE CROSS/BLUE SHIELD | Admitting: Gastroenterology

## 2016-02-06 ENCOUNTER — Encounter: Payer: Self-pay | Admitting: Gastroenterology

## 2016-02-06 VITALS — BP 142/80 | HR 64 | Ht 62.0 in | Wt 190.0 lb

## 2016-02-06 DIAGNOSIS — K649 Unspecified hemorrhoids: Secondary | ICD-10-CM

## 2016-02-06 NOTE — Progress Notes (Signed)
PROCEDURE NOTE: The patient presents with symptomatic grade 2 hemorrhoids, requesting rubber band ligation of his/her hemorrhoidal disease. All risks, benefits and alternative forms of therapy were described and informed consent was obtained. DRE revealed that the recent fissure was healed. Anoscopy revealed 3 columns int HR, largest was right anterior.  The anorectum was pre-medicated with 0.125% NTG and 5% lidocaine The decision was made to band the RA internal hemorrhoids, and [facility] to perform band ligation without complication. Digital anorectal examination was then performed to assure proper positioning of the band, and to adjust the banded tissue as required. The patient was discharged home without pain or other issues. Dietary and behavioral recommendations were given and along with follow-up instructions.   The following adjunctive treatments were recommended:  Trial of Linzess 72 micrograms once daily instead of ducloax for the next 7-10 days  The patient will return 4 weeks  for follow-up and possible additional banding as required. No complications were encountered and the patient tolerated the procedure well.

## 2016-02-06 NOTE — Patient Instructions (Addendum)
HEMORRHOID BANDING PROCEDURE    FOLLOW-UP CARE   1. The procedure you have had should have been relatively painless since the banding of the area involved does not have nerve endings and there is no pain sensation.  The rubber band cuts off the blood supply to the hemorrhoid and the band may fall off as soon as 48 hours after the banding (the band may occasionally be seen in the toilet bowl following a bowel movement). You may notice a temporary feeling of fullness in the rectum which should respond adequately to plain Tylenol or Motrin.  2. Following the banding, avoid strenuous exercise that evening and resume full activity the next day.  A sitz bath (soaking in a warm tub) or bidet is soothing, and can be useful for cleansing the area after bowel movements.     3. To avoid constipation, take two tablespoons of natural wheat bran, natural oat bran, flax, Benefiber or any over the counter fiber supplement and increase your water intake to 7-8 glasses daily.    4. Unless you have been prescribed anorectal medication, do not put anything inside your rectum for two weeks: No suppositories, enemas, fingers, etc.  5. Occasionally, you may have more bleeding than usual after the banding procedure.  This is often from the untreated hemorrhoids rather than the treated one.  Don't be concerned if there is a tablespoon or so of blood.  If there is more blood than this, lie flat with your bottom higher than your head and apply an ice pack to the area. If the bleeding does not stop within a half an hour or if you feel faint, call our office at [phone] or go to the emergency room.  6. Problems are not common; however, if there is a substantial amount of bleeding, severe pain, chills, fever or difficulty passing urine (very rare) or other problems, you should call us at [phone] or report to the nearest emergency room.  7. Do not stay seated continuously for more than 2-3 hours for a day or two  after the procedure.  Tighten your buttock muscles 10-15 times every two hours and take 10-15 deep breaths every 1-2 hours.  Do not spend more than a few minutes on the toilet if you cannot empty your bowel; instead re-visit the toilet at a later time.   Stop using Nitro-ointment for now.  Please start using Linzess 72 mcg once a day.  We will call you to schedule your 2nd banding   Thank you for choosing [facility]  Dr Amada JupiterHenry Danis III

## 2016-02-10 ENCOUNTER — Other Ambulatory Visit (INDEPENDENT_AMBULATORY_CARE_PROVIDER_SITE_OTHER): Payer: BLUE CROSS/BLUE SHIELD

## 2016-02-10 ENCOUNTER — Encounter: Payer: Self-pay | Admitting: Internal Medicine

## 2016-02-10 ENCOUNTER — Ambulatory Visit (INDEPENDENT_AMBULATORY_CARE_PROVIDER_SITE_OTHER): Payer: BLUE CROSS/BLUE SHIELD | Admitting: Internal Medicine

## 2016-02-10 VITALS — BP 182/110 | HR 68 | Temp 98.4°F | Resp 14 | Ht 62.0 in | Wt 191.8 lb

## 2016-02-10 DIAGNOSIS — I10 Essential (primary) hypertension: Secondary | ICD-10-CM | POA: Diagnosis not present

## 2016-02-10 LAB — CBC
HCT: 35.2 % — ABNORMAL LOW (ref 36.0–46.0)
Hemoglobin: 11.7 g/dL — ABNORMAL LOW (ref 12.0–15.0)
MCHC: 33.1 g/dL (ref 30.0–36.0)
MCV: 81.6 fl (ref 78.0–100.0)
Platelets: 359 10*3/uL (ref 150.0–400.0)
RBC: 4.32 Mil/uL (ref 3.87–5.11)
RDW: 16 % — ABNORMAL HIGH (ref 11.5–15.5)
WBC: 5.6 10*3/uL (ref 4.0–10.5)

## 2016-02-10 LAB — COMPREHENSIVE METABOLIC PANEL
ALK PHOS: 46 U/L (ref 39–117)
ALT: 16 U/L (ref 0–35)
AST: 21 U/L (ref 0–37)
Albumin: 4.2 g/dL (ref 3.5–5.2)
BILIRUBIN TOTAL: 0.4 mg/dL (ref 0.2–1.2)
BUN: 10 mg/dL (ref 6–23)
CALCIUM: 10.2 mg/dL (ref 8.4–10.5)
CO2: 29 meq/L (ref 19–32)
CREATININE: 0.78 mg/dL (ref 0.40–1.20)
Chloride: 104 mEq/L (ref 96–112)
GFR: 103.18 mL/min (ref >60.00)
Glucose, Bld: 86 mg/dL (ref 70–99)
Potassium: 3.5 mEq/L (ref 3.5–5.1)
Sodium: 139 mEq/L (ref 135–145)
TOTAL PROTEIN: 7.1 g/dL (ref 6.0–8.3)

## 2016-02-10 NOTE — Progress Notes (Signed)
   Subjective:    Patient ID: Jane Hampton, female    DOB: 07-20-1970, 45 y.o.   MRN: [id number]  HPI The patient is a 45 YO female coming in for blood pressure follow up. About 1 month ago she was feeling poorly and having high blood pressure. She came in to the clinic and was started on another blood pressure medicine. She started taking amlodipine and is still taking lisinopril daily. Denies side effects from those. No headaches and she is feeling well. BP was normal at hemorrhoid banding last week. No chest pains or SOB, no headaches. Denies blood in her stool since the banding.   Review of Systems  Constitutional: Negative.   Respiratory: Negative.   Cardiovascular: Negative.   Gastrointestinal: Negative.   Musculoskeletal: Negative.   Skin: Negative.   Neurological: Negative.       Objective:   Physical Exam  Constitutional: She is oriented to person, place, and time. She appears well-developed and well-nourished.  HENT:  Head: Normocephalic and atraumatic.  Eyes: EOM are normal.  Cardiovascular: Normal rate and regular rhythm.   Pulmonary/Chest: Effort normal and breath sounds normal. No respiratory distress. She has no wheezes. She has no rales.  Abdominal: Soft.  Neurological: She is alert and oriented to person, place, and time.  Skin: Skin is warm and dry.   Vitals:   02/10/16 1340  BP: (!) 168/98  Pulse: 68  Resp: 14  Temp: 98.4 F (36.9 C)  TempSrc: Oral  SpO2: 96%  Weight: 191 lb 12.8 oz (87 kg)  Height: 5\' 2"  (1.575 m)      Assessment & Plan:

## 2016-02-10 NOTE — Progress Notes (Signed)
Pre visit review using our clinic review tool, if applicable. No additional management support is needed unless otherwise documented below in the visit note. 

## 2016-02-10 NOTE — Patient Instructions (Signed)
We will check the labs today and call you back with the results.  Check the blood pressure about 1-2 times per week at home and if it is still elevated (>150 for top number or >90 for bottom number) call our office and we may need to change the dose of your medicine.

## 2016-02-11 NOTE — Assessment & Plan Note (Signed)
She does not want change to meds today due to prior good control and feeling better at home. She will continue to take amlodipine and lisinopril. If BP still elevated will need increase. Both meds have room for titration. Checking CBC and CMP. Adjust as needed.

## 2016-02-25 ENCOUNTER — Other Ambulatory Visit: Payer: Self-pay | Admitting: Internal Medicine

## 2016-02-25 ENCOUNTER — Ambulatory Visit
Admission: RE | Admit: 2016-02-25 | Discharge: 2016-02-25 | Disposition: A | Discharge status: Home / Self Care | Payer: BLUE CROSS/BLUE SHIELD | Source: Ambulatory Visit | Attending: Internal Medicine | Admitting: Internal Medicine

## 2016-02-25 DIAGNOSIS — Z1231 Encounter for screening mammogram for malignant neoplasm of breast: Secondary | ICD-10-CM | POA: Diagnosis not present

## 2016-03-13 ENCOUNTER — Ambulatory Visit: Payer: BLUE CROSS/BLUE SHIELD | Admitting: Internal Medicine

## 2016-07-14 ENCOUNTER — Encounter (HOSPITAL_COMMUNITY): Payer: Self-pay | Admitting: Emergency Medicine

## 2016-07-14 ENCOUNTER — Ambulatory Visit (HOSPITAL_COMMUNITY)
Admission: EM | Admit: 2016-07-14 | Discharge: 2016-07-14 | Disposition: A | Discharge status: Home / Self Care | Payer: BLUE CROSS/BLUE SHIELD | Attending: Family Medicine | Admitting: Family Medicine

## 2016-07-14 DIAGNOSIS — L232 Allergic contact dermatitis due to cosmetics: Secondary | ICD-10-CM

## 2016-07-14 MED ORDER — HYDROXYZINE HCL 25 MG PO TABS: 25.0000 mg | ORAL_TABLET | Freq: Four times a day (QID) | ORAL | 0 refills | Status: DC

## 2016-07-14 MED ORDER — METHYLPREDNISOLONE 4 MG PO TBPK: ORAL_TABLET | ORAL | 0 refills | Status: DC

## 2016-07-14 NOTE — ED Provider Notes (Signed)
CSN: [id number]     Arrival date & time 07/14/16  1229 History   First MD Initiated Contact with Patient 07/14/16 1310     Chief Complaint  Patient presents with  . Rash   (Consider location/radiation/quality/duration/timing/severity/associated sxs/prior Treatment) Patient c/o rash on face due to cosmetics.   The history is provided by the patient.  Rash  Location:  Face Facial rash location:  Upper lip and lower lip Quality: itchiness and redness   Severity:  Mild Onset quality:  Sudden Duration:  5 days Timing:  Constant Progression:  Unchanged Chronicity:  New Relieved by:  Nothing Worsened by:  Nothing Associated symptoms: sore throat     Past Medical History:  Diagnosis Date  . Hypertension   . Migraines    Past Surgical History:  Procedure Laterality Date  . CESAREAN SECTION    . TONSILLECTOMY    . TUBAL LIGATION     Family History  Problem Relation Age of Onset  . Hypertension Mother   . Heart disease Mother   . Diabetes Mother   . Hypertension Father   . Anuerysm Father     brain  . Diabetes Maternal Grandmother   . Anuerysm Sister     brain   Social History  Substance Use Topics  . Smoking status: Former Smoker    Years: 15.00    Types: Cigarettes    Quit date: 10/02/2000  . Smokeless tobacco: Never Used  . Alcohol use No   OB History    No data available     Review of Systems  Constitutional: Negative.   HENT: Positive for sore throat.   Eyes: Negative.   Respiratory: Negative.   Cardiovascular: Negative.   Gastrointestinal: Negative.   Endocrine: Negative.   Genitourinary: Negative.   Musculoskeletal: Negative.   Skin: Positive for rash.  Allergic/Immunologic: Negative.   Neurological: Negative.   Hematological: Negative.   Psychiatric/Behavioral: Negative.     Allergies  Patient has no known allergies.  Home Medications   Prior to Admission medications   Medication Sig Start Date End Date Taking? Authorizing Provider   amLODipine (NORVASC) 5 MG tablet Take 1 tablet (5 mg total) by mouth at bedtime. 01/10/16  Yes Bonna Gainsharlotte Lum Nche, NP  lisinopril (PRINIVIL,ZESTRIL) 20 MG tablet TAKE 1 TABLET EVERY DAY (PT NEEDS OFFICE VISIT BEFORE ANY MORE REFILLS) 09/06/15  Yes Corwin LevinsJames W John, MD  hydrOXYzine (ATARAX/VISTARIL) 25 MG tablet Take 1 tablet (25 mg total) by mouth every 6 (six) hours. 07/14/16   Deatra CanterWilliam J Aleiyah Halpin, FNP  methylPREDNISolone (MEDROL DOSEPAK) 4 MG TBPK tablet Take 6-5-4-3-2-1 po qd 07/14/16   Deatra CanterWilliam J Mardie Kellen, FNP   Meds Ordered and Administered this Visit  Medications - No data to display  BP (!) 195/83 (BP Location: Right Arm)   Pulse 96   Temp 98.3 F (36.8 C) (Oral)   Resp 16   SpO2 98%  No data found.   Physical Exam  Constitutional: She is oriented to person, place, and time. She appears well-developed and well-nourished.  HENT:  Head: Normocephalic.  Right Ear: External ear normal.  Left Ear: External ear normal.  Mouth/Throat: Oropharynx is clear and moist.  Eyes: Conjunctivae and EOM are normal. Pupils are equal, round, and reactive to light.  Neck: Normal range of motion. Neck supple.  Cardiovascular: Normal rate, regular rhythm and normal heart sounds.   Pulmonary/Chest: Effort normal and breath sounds normal.  Musculoskeletal: Normal range of motion.  Neurological: She is alert and oriented to  person, place, and time.  Skin: Rash noted.  Periorbital rash that is itchy  Vitals reviewed.   Urgent Care Course     Procedures (including critical care time)  Labs Review Labs Reviewed - No data to display  Imaging Review No results found.   Visual Acuity Review  Right Eye Distance:   Left Eye Distance:   Bilateral Distance:    Right Eye Near:   Left Eye Near:    Bilateral Near:         MDM   1. Allergic contact dermatitis due to cosmetics    Medrol dose pack as directed 4mg  #21 Hydroxyzine 25mg  one po qid prn #12      Deatra Canter, FNP 07/14/16  1346

## 2016-07-14 NOTE — ED Triage Notes (Signed)
[facility] a [facility] of a rash on her face that she suspects to be an allergic reaction to make up that she wore 5 days ago.

## 2016-08-26 ENCOUNTER — Other Ambulatory Visit: Payer: Self-pay | Admitting: Internal Medicine

## 2016-10-07 ENCOUNTER — Ambulatory Visit: Payer: BLUE CROSS/BLUE SHIELD | Admitting: Nurse Practitioner

## 2016-10-07 ENCOUNTER — Ambulatory Visit: Payer: BLUE CROSS/BLUE SHIELD | Admitting: Internal Medicine

## 2016-10-14 ENCOUNTER — Ambulatory Visit: Payer: BLUE CROSS/BLUE SHIELD | Admitting: Nurse Practitioner

## 2016-10-20 ENCOUNTER — Ambulatory Visit (INDEPENDENT_AMBULATORY_CARE_PROVIDER_SITE_OTHER): Payer: BLUE CROSS/BLUE SHIELD | Admitting: Nurse Practitioner

## 2016-10-20 ENCOUNTER — Encounter: Payer: Self-pay | Admitting: Nurse Practitioner

## 2016-10-20 VITALS — BP 190/100 | HR 72 | Temp 98.7°F | Resp 14 | Ht 62.0 in | Wt 196.0 lb

## 2016-10-20 DIAGNOSIS — I10 Essential (primary) hypertension: Secondary | ICD-10-CM

## 2016-10-20 MED ORDER — LISINOPRIL 20 MG PO TABS: 20.0000 mg | ORAL_TABLET | Freq: Every day | ORAL | 0 refills | Status: DC

## 2016-10-20 MED ORDER — AMLODIPINE BESYLATE 10 MG PO TABS: 10.0000 mg | ORAL_TABLET | Freq: Every day | ORAL | 0 refills | Status: DC

## 2016-10-20 NOTE — Progress Notes (Signed)
Subjective:  Patient ID: Jane Hampton, female    DOB: 1971-04-06  Age: 46 y.o. MRN: [id number]  CC: office visit (BP been running high, wants help on diet plan and exercise )   HPI  HTN: Uncontrolled. Not taking amlodipine as prescribed. Not following any specific diet. Does not exercise.  Outpatient Medications Prior to Visit  Medication Sig Dispense Refill  . lisinopril (PRINIVIL,ZESTRIL) 20 MG tablet Take 1 tablet (20 mg total) by mouth daily. TAKE 1 TABLET EVERY DAY 90 tablet 1  . amLODipine (NORVASC) 5 MG tablet Take 1 tablet (5 mg total) by mouth at bedtime. (Patient not taking: Reported on 10/20/2016) 90 tablet 1  . hydrOXYzine (ATARAX/VISTARIL) 25 MG tablet Take 1 tablet (25 mg total) by mouth every 6 (six) hours. (Patient not taking: Reported on 10/20/2016) 12 tablet 0  . methylPREDNISolone (MEDROL DOSEPAK) 4 MG TBPK tablet Take 6-5-4-3-2-1 po qd (Patient not taking: Reported on 10/20/2016) 21 tablet 0   No facility-administered medications prior to visit.     ROS Review of Systems  Constitutional: Negative for diaphoresis and malaise/fatigue.  Eyes: Negative for blurred vision.  Respiratory: Negative for shortness of breath.   Cardiovascular: Negative for chest pain, palpitations and leg swelling.  Musculoskeletal: Negative for falls and myalgias.  Skin: Negative.   Neurological: Negative for dizziness and headaches.  Psychiatric/Behavioral: Negative for depression. The patient is not nervous/anxious and does not have insomnia.      Objective:  BP (!) 190/100 (BP Location: Left Arm, Patient Position: Sitting, Cuff Size: Normal)   Pulse 72   Temp 98.7 F (37.1 C) (Oral)   Resp 14   Ht 5\' 2"  (1.575 m)   Wt 196 lb (88.9 kg)   LMP 09/29/2016 (Approximate)   BMI 35.85 kg/m   BP Readings from Last 3 Encounters:  10/20/16 (!) 190/100  07/14/16 (!) 195/83  02/10/16 (!) 182/110    Wt Readings from Last 3 Encounters:  10/20/16 196 lb (88.9 kg)  02/10/16  191 lb 12.8 oz (87 kg)  02/06/16 190 lb (86.2 kg)    Physical Exam  Constitutional: She is oriented to person, place, and time. No distress.  Neck: Normal range of motion. Neck supple. No JVD present.  Cardiovascular: Normal rate, regular rhythm and normal heart sounds.   Pulmonary/Chest: Effort normal and breath sounds normal. No respiratory distress.  Abdominal: Soft.  Musculoskeletal: She exhibits no edema.  Neurological: She is alert and oriented to person, place, and time.  Skin: Skin is warm and dry.  Psychiatric: She has a normal mood and affect. Her behavior is normal.    Lab Results  Component Value Date   WBC 5.6 02/10/2016   HGB 11.7 (L) 02/10/2016   HCT 35.2 (L) 02/10/2016   PLT 359.0 02/10/2016   GLUCOSE 86 02/10/2016   CHOL 181 09/06/2015   TRIG 56.0 09/06/2015   HDL 69.60 09/06/2015   LDLCALC 100 (H) 09/06/2015   ALT 16 02/10/2016   AST 21 02/10/2016   NA 139 02/10/2016   K 3.5 02/10/2016   CL 104 02/10/2016   CREATININE 0.78 02/10/2016   BUN 10 02/10/2016   CO2 29 02/10/2016   TSH 0.38 09/06/2015   MICROALBUR 4.40 (H) 08/24/2008    No results found.  Assessment & Plan:   Jane Hampton was seen today for office visit.  Diagnoses and all orders for this visit:  Essential hypertension -     amLODipine (NORVASC) 10 MG tablet; Take 1 tablet (10 mg total)  by mouth at bedtime. -     lisinopril (PRINIVIL,ZESTRIL) 20 MG tablet; Take 1 tablet (20 mg total) by mouth daily. TAKE 1 TABLET EVERY DAY   I have discontinued Jane Hampton's methylPREDNISolone and hydrOXYzine. I have also changed her amLODipine. Additionally, I am having her maintain her lisinopril.  Meds ordered this encounter  Medications  . amLODipine (NORVASC) 10 MG tablet    Sig: Take 1 tablet (10 mg total) by mouth at bedtime.    Dispense:  90 tablet    Refill:  0    Order Specific Question:   Supervising Provider    Answer:   Pincus SanesBURNS, STACY J [[id number]]  . lisinopril (PRINIVIL,ZESTRIL) 20 MG  tablet    Sig: Take 1 tablet (20 mg total) by mouth daily. TAKE 1 TABLET EVERY DAY    Dispense:  90 tablet    Refill:  0    Order Specific Question:   Supervising Provider    Answer:   Pincus SanesBURNS, STACY J [[id number]]    Follow-up: Return in about 4 weeks (around 11/17/2016) for HTN.  Jane Pennaharlotte Zadok Holaway, NP

## 2016-10-20 NOTE — Patient Instructions (Addendum)
Check BP once day and record. Please bring readings to next office visit.  Hypertension Hypertension is another name for high blood pressure. High blood pressure forces your heart to work harder to pump blood. This can cause problems over time. There are two numbers in a blood pressure reading. There is a top number (systolic) over a bottom number (diastolic). It is best to have a blood pressure below 120/80. Healthy choices can help lower your blood pressure. You may need medicine to help lower your blood pressure if:  Your blood pressure cannot be lowered with healthy choices.  Your blood pressure is higher than 130/80.  Follow these instructions at home: Eating and drinking  If directed, follow the DASH eating plan. This diet includes: ? Filling half of your plate at each meal with fruits and vegetables. ? Filling one quarter of your plate at each meal with whole grains. Whole grains include whole wheat pasta, brown rice, and whole grain bread. ? Eating or drinking low-fat dairy products, such as skim milk or low-fat yogurt. ? Filling one quarter of your plate at each meal with low-fat (lean) proteins. Low-fat proteins include fish, skinless chicken, eggs, beans, and tofu. ? Avoiding fatty meat, cured and processed meat, or chicken with skin. ? Avoiding premade or processed food.  Eat less than 1,500 mg of salt (sodium) a day.  Limit alcohol use to no more than 1 drink a day for nonpregnant women and 2 drinks a day for men. One drink equals 12 oz of beer, 5 oz of wine, or 1 oz of hard liquor. Lifestyle  Work with your doctor to stay at a healthy weight or to lose weight. Ask your doctor what the best weight is for you.  Get at least 30 minutes of exercise that causes your heart to beat faster (aerobic exercise) most days of the week. This may include walking, swimming, or biking.  Get at least 30 minutes of exercise that strengthens your muscles (resistance exercise) at least 3  days a week. This may include lifting weights or pilates.  Do not use any products that contain nicotine or tobacco. This includes cigarettes and e-cigarettes. If you need help quitting, ask your doctor.  Check your blood pressure at home as told by your doctor.  Keep all follow-up visits as told by your doctor. This is important. Medicines  Take over-the-counter and prescription medicines only as told by your doctor. Follow directions carefully.  Do not skip doses of blood pressure medicine. The medicine does not work as well if you skip doses. Skipping doses also puts you at risk for problems.  Ask your doctor about side effects or reactions to medicines that you should watch for. Contact a doctor if:  You think you are having a reaction to the medicine you are taking.  You have headaches that keep coming back (recurring).  You feel dizzy.  You have swelling in your ankles.  You have trouble with your vision. Get help right away if:  You get a very bad headache.  You start to feel confused.  You feel weak or numb.  You feel faint.  You get very bad pain in your: ? Chest. ? Belly (abdomen).  You throw up (vomit) more than once.  You have trouble breathing. Summary  Hypertension is another name for high blood pressure.  Making healthy choices can help lower blood pressure. If your blood pressure cannot be controlled with healthy choices, you may need to take medicine. This  information is not intended to replace advice given to you by your health care provider. Make sure you discuss any questions you have with your health care provider. Document Released: 09/30/2007 Document Revised: 03/11/2016 Document Reviewed: 03/11/2016 Elsevier Interactive Patient Education  2018 [employer].  DASH Eating Plan DASH stands for "Dietary Approaches to Stop Hypertension." The DASH eating plan is a healthy eating plan that has been shown to reduce high blood pressure  (hypertension). It may also reduce your risk for type 2 diabetes, heart disease, and stroke. The DASH eating plan may also help with weight loss. What are tips for following this plan? General guidelines  Avoid eating more than 2,300 mg (milligrams) of salt (sodium) a day. If you have hypertension, you may need to reduce your sodium intake to 1,500 mg a day.  Limit alcohol intake to no more than 1 drink a day for nonpregnant women and 2 drinks a day for men. One drink equals 12 oz of beer, 5 oz of wine, or 1 oz of hard liquor.  Work with your health care provider to maintain a healthy body weight or to lose weight. Ask what an ideal weight is for you.  Get at least 30 minutes of exercise that causes your heart to beat faster (aerobic exercise) most days of the week. Activities may include walking, swimming, or biking.  Work with your health care provider or diet and nutrition specialist (dietitian) to adjust your eating plan to your individual calorie needs. Reading food labels  Check food labels for the amount of sodium per serving. Choose foods with less than 5 percent of the Daily Value of sodium. Generally, foods with less than 300 mg of sodium per serving fit into this eating plan.  To find whole grains, look for the word "whole" as the first word in the ingredient list. Shopping  Buy products labeled as "low-sodium" or "no salt added."  Buy fresh foods. Avoid canned foods and premade or frozen meals. Cooking  Avoid adding salt when cooking. Use salt-free seasonings or herbs instead of table salt or sea salt. Check with your health care provider or pharmacist before using salt substitutes.  Do not fry foods. Cook foods using healthy methods such as baking, boiling, grilling, and broiling instead.  Cook with heart-healthy oils, such as olive, canola, soybean, or sunflower oil. Meal planning   Eat a balanced diet that includes: ? 5 or more servings of fruits and vegetables each  day. At each meal, try to fill half of your plate with fruits and vegetables. ? Up to 6-8 servings of whole grains each day. ? Less than 6 oz of lean meat, poultry, or fish each day. A 3-oz serving of meat is about the same size as a deck of cards. One egg equals 1 oz. ? 2 servings of low-fat dairy each day. ? A serving of nuts, seeds, or beans 5 times each week. ? Heart-healthy fats. Healthy fats called Omega-3 fatty acids are found in foods such as flaxseeds and coldwater fish, like sardines, salmon, and mackerel.  Limit how much you eat of the following: ? Canned or prepackaged foods. ? Food that is high in trans fat, such as fried foods. ? Food that is high in saturated fat, such as fatty meat. ? Sweets, desserts, sugary drinks, and other foods with added sugar. ? Full-fat dairy products.  Do not salt foods before eating.  Try to eat at least 2 vegetarian meals each week.  Eat more home-cooked  food and less restaurant, buffet, and fast food.  When eating at a restaurant, ask that your food be prepared with less salt or no salt, if possible. What foods are recommended? The items listed may not be a complete list. Talk with your dietitian about what dietary choices are best for you. Grains Whole-grain or whole-wheat bread. Whole-grain or whole-wheat pasta. Brown rice. Orpah Cobb. Bulgur. Whole-grain and low-sodium cereals. Pita bread. Low-fat, low-sodium crackers. Whole-wheat flour tortillas. Vegetables Fresh or frozen vegetables (raw, steamed, roasted, or grilled). Low-sodium or reduced-sodium tomato and vegetable juice. Low-sodium or reduced-sodium tomato sauce and tomato paste. Low-sodium or reduced-sodium canned vegetables. Fruits All fresh, dried, or frozen fruit. Canned fruit in natural juice (without added sugar). Meat and other protein foods Skinless chicken or [place]. Ground chicken or [place]. Pork with fat trimmed off. Fish and seafood. Egg whites. Dried beans, peas, or  lentils. Unsalted nuts, nut butters, and seeds. Unsalted canned beans. Lean cuts of beef with fat trimmed off. Low-sodium, lean deli meat. Dairy Low-fat (1%) or fat-free (skim) milk. Fat-free, low-fat, or reduced-fat cheeses. Nonfat, low-sodium ricotta or cottage cheese. Low-fat or nonfat yogurt. Low-fat, low-sodium cheese. Fats and oils Soft margarine without trans fats. Vegetable oil. Low-fat, reduced-fat, or light mayonnaise and salad dressings (reduced-sodium). Canola, safflower, olive, soybean, and sunflower oils. Avocado. Seasoning and other foods Herbs. Spices. Seasoning mixes without salt. Unsalted popcorn and pretzels. Fat-free sweets. What foods are not recommended? The items listed may not be a complete list. Talk with your dietitian about what dietary choices are best for you. Grains Baked goods made with fat, such as croissants, muffins, or some breads. Dry pasta or rice meal packs. Vegetables Creamed or fried vegetables. Vegetables in a cheese sauce. Regular canned vegetables (not low-sodium or reduced-sodium). Regular canned tomato sauce and paste (not low-sodium or reduced-sodium). Regular tomato and vegetable juice (not low-sodium or reduced-sodium). Rosita Fire. Olives. Fruits Canned fruit in a light or heavy syrup. Fried fruit. Fruit in cream or butter sauce. Meat and other protein foods Fatty cuts of meat. Ribs. Fried meat. Tomasa Blase. Sausage. Bologna and other processed lunch meats. Salami. Fatback. Hotdogs. Bratwurst. Salted nuts and seeds. Canned beans with added salt. Canned or smoked fish. Whole eggs or egg yolks. Chicken or [place] with skin. Dairy Whole or 2% milk, cream, and half-and-half. Whole or full-fat cream cheese. Whole-fat or sweetened yogurt. Full-fat cheese. Nondairy creamers. Whipped toppings. Processed cheese and cheese spreads. Fats and oils Butter. Stick margarine. Lard. Shortening. Ghee. Bacon fat. Tropical oils, such as coconut, palm kernel, or palm  oil. Seasoning and other foods Salted popcorn and pretzels. Onion salt, garlic salt, seasoned salt, table salt, and sea salt. Worcestershire sauce. Tartar sauce. Barbecue sauce. Teriyaki sauce. Soy sauce, including reduced-sodium. Steak sauce. Canned and packaged gravies. Fish sauce. Oyster sauce. Cocktail sauce. Horseradish that you find on the shelf. Ketchup. Mustard. Meat flavorings and tenderizers. Bouillon cubes. Hot sauce and Tabasco sauce. Premade or packaged marinades. Premade or packaged taco seasonings. Relishes. Regular salad dressings. Where to find more information:  National Heart, Lung, and Blood Institute: PopSteam.is  American Heart Association: www.heart.org Summary  The DASH eating plan is a healthy eating plan that has been shown to reduce high blood pressure (hypertension). It may also reduce your risk for type 2 diabetes, heart disease, and stroke.  With the DASH eating plan, you should limit salt (sodium) intake to 2,300 mg a day. If you have hypertension, you may need to reduce your sodium intake to 1,500 mg  a day.  When on the DASH eating plan, aim to eat more fresh fruits and vegetables, whole grains, lean proteins, low-fat dairy, and heart-healthy fats.  Work with your health care provider or diet and nutrition specialist (dietitian) to adjust your eating plan to your individual calorie needs. This information is not intended to replace advice given to you by your health care provider. Make sure you discuss any questions you have with your health care provider. Document Released: 04/02/2011 Document Revised: 04/06/2016 Document Reviewed: 04/06/2016 Elsevier Interactive Patient Education  2017 [employer]

## 2016-11-17 ENCOUNTER — Ambulatory Visit: Payer: BLUE CROSS/BLUE SHIELD | Admitting: Nurse Practitioner

## 2016-11-20 ENCOUNTER — Encounter: Payer: Self-pay | Admitting: Nurse Practitioner

## 2016-11-20 ENCOUNTER — Ambulatory Visit (INDEPENDENT_AMBULATORY_CARE_PROVIDER_SITE_OTHER): Payer: BLUE CROSS/BLUE SHIELD | Admitting: Nurse Practitioner

## 2016-11-20 VITALS — BP 152/88 | HR 70 | Temp 98.3°F | Ht 62.0 in | Wt 194.0 lb

## 2016-11-20 DIAGNOSIS — I10 Essential (primary) hypertension: Secondary | ICD-10-CM | POA: Diagnosis not present

## 2016-11-20 MED ORDER — AMLODIPINE BESYLATE 10 MG PO TABS: 10.0000 mg | ORAL_TABLET | Freq: Every day | ORAL | 0 refills | Status: DC

## 2016-11-20 MED ORDER — LISINOPRIL 20 MG PO TABS: 20.0000 mg | ORAL_TABLET | Freq: Every day | ORAL | 0 refills | Status: DC

## 2016-11-20 NOTE — Progress Notes (Signed)
Subjective:  Patient ID: Jane Hampton, female    DOB: [birth year]  Age: 46 y.o. MRN: [id number]  CC: Follow-up (hypertension)   HPI HTN:  Home SBP: 140s-150s She has made changes to her diet and now walking daily. Denies any adverse effects with amlodipine and lisinopril.  Outpatient Medications Prior to Visit  Medication Sig Dispense Refill  . amLODipine (NORVASC) 10 MG tablet Take 1 tablet (10 mg total) by mouth at bedtime. 90 tablet 0  . lisinopril (PRINIVIL,ZESTRIL) 20 MG tablet Take 1 tablet (20 mg total) by mouth daily. TAKE 1 TABLET EVERY DAY 90 tablet 0   No facility-administered medications prior to visit.     ROS Review of Systems  Respiratory: Negative for cough and shortness of breath.   Cardiovascular: Negative for chest pain, palpitations and leg swelling.  Neurological: Negative for dizziness and headaches.  Psychiatric/Behavioral: The patient does not have insomnia.      Objective:  BP (!) 152/88 (BP Location: Left Arm, Patient Position: Sitting, Cuff Size: Large)   Pulse 70   Temp 98.3 F (36.8 C) (Oral)   Ht 5\' 2"  (1.575 m)   Wt 194 lb (88 kg)   SpO2 99%   BMI 35.48 kg/m   BP Readings from Last 3 Encounters:  11/20/16 (!) 152/88  10/20/16 (!) 190/100  07/14/16 (!) 195/83    Wt Readings from Last 3 Encounters:  11/20/16 194 lb (88 kg)  10/20/16 196 lb (88.9 kg)  02/10/16 191 lb 12.8 oz (87 kg)    Physical Exam  Constitutional: She is oriented to person, place, and time. No distress.  Neck: No JVD present.  Cardiovascular: Normal rate, regular rhythm and normal heart sounds.   Pulmonary/Chest: Effort normal.  Musculoskeletal: She exhibits no edema.  Neurological: She is alert and oriented to person, place, and time.  Skin: Skin is warm and dry.  Vitals reviewed.   Lab Results  Component Value Date   WBC 5.6 02/10/2016   HGB 11.7 (L) 02/10/2016   HCT 35.2 (L) 02/10/2016   PLT 359.0 02/10/2016   GLUCOSE 86 02/10/2016   CHOL  181 09/06/2015   TRIG 56.0 09/06/2015   HDL 69.60 09/06/2015   LDLCALC 100 (H) 09/06/2015   ALT 16 02/10/2016   AST 21 02/10/2016   NA 139 02/10/2016   K 3.5 02/10/2016   CL 104 02/10/2016   CREATININE 0.78 02/10/2016   BUN 10 02/10/2016   CO2 29 02/10/2016   TSH 0.38 09/06/2015   MICROALBUR 4.40 (H) 08/24/2008     Assessment & Plan:   Jane Hampton was seen today for follow-up.  Diagnoses and all orders for this visit:  Essential hypertension -     amLODipine (NORVASC) 10 MG tablet; Take 1 tablet (10 mg total) by mouth at bedtime. -     lisinopril (PRINIVIL,ZESTRIL) 20 MG tablet; Take 1 tablet (20 mg total) by mouth daily. TAKE 1 TABLET EVERY DAY   I am having Jane Hampton maintain her amLODipine and lisinopril.  Meds ordered this encounter  Medications  . amLODipine (NORVASC) 10 MG tablet    Sig: Take 1 tablet (10 mg total) by mouth at bedtime.    Dispense:  90 tablet    Refill:  0    Order Specific Question:   Supervising Provider    Answer:   Tresa GarterPLOTNIKOV, ALEKSEI V [[id number]]  . lisinopril (PRINIVIL,ZESTRIL) 20 MG tablet    Sig: Take 1 tablet (20 mg total) by mouth daily. TAKE 1 TABLET EVERY  DAY    Dispense:  90 tablet    Refill:  0    Order Specific Question:   Supervising Provider    Answer:   Tresa GarterPLOTNIKOV, ALEKSEI V [[id number]]    Follow-up: Return in about 7 weeks (around 01/05/2017) for CPE (fasting, need breast and PAP, entered labs, 30mins).  Alysia Pennaharlotte Jaine Estabrooks, NP

## 2016-11-20 NOTE — Patient Instructions (Addendum)
Check BP 2-3times a week. (morning) Bring BP records to next office visit.  Maintain current medications  DASH Eating Plan DASH stands for "Dietary Approaches to Stop Hypertension." The DASH eating plan is a healthy eating plan that has been shown to reduce high blood pressure (hypertension). It may also reduce your risk for type 2 diabetes, heart disease, and stroke. The DASH eating plan may also help with weight loss. What are tips for following this plan? General guidelines  Avoid eating more than 2,300 mg (milligrams) of salt (sodium) a day. If you have hypertension, you may need to reduce your sodium intake to 1,500 mg a day.  Limit alcohol intake to no more than 1 drink a day for nonpregnant women and 2 drinks a day for men. One drink equals 12 oz of beer, 5 oz of wine, or 1 oz of hard liquor.  Work with your health care provider to maintain a healthy body weight or to lose weight. Ask what an ideal weight is for you.  Get at least 30 minutes of exercise that causes your heart to beat faster (aerobic exercise) most days of the week. Activities may include walking, swimming, or biking.  Work with your health care provider or diet and nutrition specialist (dietitian) to adjust your eating plan to your individual calorie needs. Reading food labels  Check food labels for the amount of sodium per serving. Choose foods with less than 5 percent of the Daily Value of sodium. Generally, foods with less than 300 mg of sodium per serving fit into this eating plan.  To find whole grains, look for the word "whole" as the first word in the ingredient list. Shopping  Buy products labeled as "low-sodium" or "no salt added."  Buy fresh foods. Avoid canned foods and premade or frozen meals. Cooking  Avoid adding salt when cooking. Use salt-free seasonings or herbs instead of table salt or sea salt. Check with your health care provider or pharmacist before using salt substitutes.  Do not fry  foods. Cook foods using healthy methods such as baking, boiling, grilling, and broiling instead.  Cook with heart-healthy oils, such as olive, canola, soybean, or sunflower oil. Meal planning   Eat a balanced diet that includes: ? 5 or more servings of fruits and vegetables each day. At each meal, try to fill half of your plate with fruits and vegetables. ? Up to 6-8 servings of whole grains each day. ? Less than 6 oz of lean meat, poultry, or fish each day. A 3-oz serving of meat is about the same size as a deck of cards. One egg equals 1 oz. ? 2 servings of low-fat dairy each day. ? A serving of nuts, seeds, or beans 5 times each week. ? Heart-healthy fats. Healthy fats called Omega-3 fatty acids are found in foods such as flaxseeds and coldwater fish, like sardines, salmon, and mackerel.  Limit how much you eat of the following: ? Canned or prepackaged foods. ? Food that is high in trans fat, such as fried foods. ? Food that is high in saturated fat, such as fatty meat. ? Sweets, desserts, sugary drinks, and other foods with added sugar. ? Full-fat dairy products.  Do not salt foods before eating.  Try to eat at least 2 vegetarian meals each week.  Eat more home-cooked food and less restaurant, buffet, and fast food.  When eating at a restaurant, ask that your food be prepared with less salt or no salt, if possible. What  foods are recommended? The items listed may not be a complete list. Talk with your dietitian about what dietary choices are best for you. Grains Whole-grain or whole-wheat bread. Whole-grain or whole-wheat pasta. Brown rice. [place]. Bulgur. Whole-grain and low-sodium cereals. Pita bread. Low-fat, low-sodium crackers. Whole-wheat flour tortillas. Vegetables Fresh or frozen vegetables (raw, steamed, roasted, or grilled). Low-sodium or reduced-sodium tomato and vegetable juice. Low-sodium or reduced-sodium tomato sauce and tomato paste. Low-sodium or  reduced-sodium canned vegetables. Fruits All fresh, dried, or frozen fruit. Canned fruit in natural juice (without added sugar). Meat and other protein foods Skinless chicken or [place]. Ground chicken or [place]. Pork with fat trimmed off. Fish and seafood. Egg whites. Dried beans, peas, or lentils. Unsalted nuts, nut butters, and seeds. Unsalted canned beans. Lean cuts of beef with fat trimmed off. Low-sodium, lean deli meat. Dairy Low-fat (1%) or fat-free (skim) milk. Fat-free, low-fat, or reduced-fat cheeses. Nonfat, low-sodium ricotta or cottage cheese. Low-fat or nonfat yogurt. Low-fat, low-sodium cheese. Fats and oils Soft margarine without trans fats. Vegetable oil. Low-fat, reduced-fat, or light mayonnaise and salad dressings (reduced-sodium). Canola, safflower, olive, soybean, and sunflower oils. Avocado. Seasoning and other foods Herbs. Spices. Seasoning mixes without salt. Unsalted popcorn and pretzels. Fat-free sweets. What foods are not recommended? The items listed may not be a complete list. Talk with your dietitian about what dietary choices are best for you. Grains Baked goods made with fat, such as croissants, muffins, or some breads. Dry pasta or rice meal packs. Vegetables Creamed or fried vegetables. Vegetables in a cheese sauce. Regular canned vegetables (not low-sodium or reduced-sodium). Regular canned tomato sauce and paste (not low-sodium or reduced-sodium). Regular tomato and vegetable juice (not low-sodium or reduced-sodium). Angie Fava. Olives. Fruits Canned fruit in a light or heavy syrup. Fried fruit. Fruit in cream or butter sauce. Meat and other protein foods Fatty cuts of meat. Ribs. Fried meat. Berniece Salines. Sausage. Bologna and other processed lunch meats. Salami. Fatback. Hotdogs. Bratwurst. Salted nuts and seeds. Canned beans with added salt. Canned or smoked fish. Whole eggs or egg yolks. Chicken or [place] with skin. Dairy Whole or 2% milk, cream, and half-and-half.  Whole or full-fat cream cheese. Whole-fat or sweetened yogurt. Full-fat cheese. Nondairy creamers. Whipped toppings. Processed cheese and cheese spreads. Fats and oils Butter. Stick margarine. Lard. Shortening. Ghee. Bacon fat. Tropical oils, such as coconut, palm kernel, or palm oil. Seasoning and other foods Salted popcorn and pretzels. Onion salt, garlic salt, seasoned salt, table salt, and sea salt. Worcestershire sauce. Tartar sauce. Barbecue sauce. Teriyaki sauce. Soy sauce, including reduced-sodium. Steak sauce. Canned and packaged gravies. Fish sauce. Oyster sauce. Cocktail sauce. Horseradish that you find on the shelf. Ketchup. Mustard. Meat flavorings and tenderizers. Bouillon cubes. Hot sauce and Tabasco sauce. Premade or packaged marinades. Premade or packaged taco seasonings. Relishes. Regular salad dressings. Where to find more information:  National Heart, Lung, and [place]: https://wilson-eaton.com/  American Heart Association: www.heart.org Summary  The DASH eating plan is a healthy eating plan that has been shown to reduce high blood pressure (hypertension). It may also reduce your risk for type 2 diabetes, heart disease, and stroke.  With the DASH eating plan, you should limit salt (sodium) intake to 2,300 mg a day. If you have hypertension, you may need to reduce your sodium intake to 1,500 mg a day.  When on the DASH eating plan, aim to eat more fresh fruits and vegetables, whole grains, lean proteins, low-fat dairy, and heart-healthy fats.  Work with  your health care provider or diet and nutrition specialist (dietitian) to adjust your eating plan to your individual calorie needs. This information is not intended to replace advice given to you by your health care provider. Make sure you discuss any questions you have with your health care provider. Document Released: 04/02/2011 Document Revised: 04/06/2016 Document Reviewed: 04/06/2016 Elsevier Interactive Patient Education   2017 [employer].

## 2017-01-05 ENCOUNTER — Other Ambulatory Visit (HOSPITAL_COMMUNITY)
Admission: RE | Admit: 2017-01-05 | Discharge: 2017-01-05 | Disposition: A | Discharge status: Home / Self Care | Payer: BLUE CROSS/BLUE SHIELD | Source: Ambulatory Visit | Attending: Internal Medicine | Admitting: Internal Medicine

## 2017-01-05 ENCOUNTER — Encounter: Payer: Self-pay | Admitting: Nurse Practitioner

## 2017-01-05 ENCOUNTER — Other Ambulatory Visit (INDEPENDENT_AMBULATORY_CARE_PROVIDER_SITE_OTHER): Payer: BLUE CROSS/BLUE SHIELD

## 2017-01-05 ENCOUNTER — Other Ambulatory Visit: Payer: BLUE CROSS/BLUE SHIELD

## 2017-01-05 ENCOUNTER — Ambulatory Visit (INDEPENDENT_AMBULATORY_CARE_PROVIDER_SITE_OTHER): Payer: BLUE CROSS/BLUE SHIELD | Admitting: Nurse Practitioner

## 2017-01-05 VITALS — BP 160/94 | HR 84 | Temp 98.0°F | Ht 62.0 in | Wt 195.0 lb

## 2017-01-05 DIAGNOSIS — Z136 Encounter for screening for cardiovascular disorders: Secondary | ICD-10-CM

## 2017-01-05 DIAGNOSIS — I10 Essential (primary) hypertension: Secondary | ICD-10-CM

## 2017-01-05 DIAGNOSIS — Z13220 Encounter for screening for lipoid disorders: Secondary | ICD-10-CM

## 2017-01-05 DIAGNOSIS — Z124 Encounter for screening for malignant neoplasm of cervix: Secondary | ICD-10-CM | POA: Diagnosis not present

## 2017-01-05 DIAGNOSIS — Z114 Encounter for screening for human immunodeficiency virus [HIV]: Secondary | ICD-10-CM

## 2017-01-05 DIAGNOSIS — K5904 Chronic idiopathic constipation: Secondary | ICD-10-CM | POA: Diagnosis not present

## 2017-01-05 DIAGNOSIS — Z01419 Encounter for gynecological examination (general) (routine) without abnormal findings: Secondary | ICD-10-CM | POA: Insufficient documentation

## 2017-01-05 DIAGNOSIS — R739 Hyperglycemia, unspecified: Secondary | ICD-10-CM | POA: Diagnosis not present

## 2017-01-05 DIAGNOSIS — Z23 Encounter for immunization: Secondary | ICD-10-CM | POA: Diagnosis not present

## 2017-01-05 DIAGNOSIS — Z0000 Encounter for general adult medical examination without abnormal findings: Secondary | ICD-10-CM | POA: Diagnosis not present

## 2017-01-05 DIAGNOSIS — Z0001 Encounter for general adult medical examination with abnormal findings: Secondary | ICD-10-CM | POA: Diagnosis not present

## 2017-01-05 DIAGNOSIS — Z8249 Family history of ischemic heart disease and other diseases of the circulatory system: Secondary | ICD-10-CM

## 2017-01-05 LAB — COMPREHENSIVE METABOLIC PANEL
ALK PHOS: 49 U/L (ref 39–117)
ALT: 18 U/L (ref 0–35)
AST: 24 U/L (ref 0–37)
Albumin: 4.1 g/dL (ref 3.5–5.2)
BILIRUBIN TOTAL: 0.2 mg/dL (ref 0.2–1.2)
BUN: 15 mg/dL (ref 6–23)
CO2: 24 mEq/L (ref 19–32)
CREATININE: 1.03 mg/dL (ref 0.40–1.20)
Calcium: 9.7 mg/dL (ref 8.4–10.5)
Chloride: 104 mEq/L (ref 96–112)
GFR: 74.56 mL/min (ref >60.00)
GLUCOSE: 89 mg/dL (ref 70–99)
Potassium: 3.6 mEq/L (ref 3.5–5.1)
SODIUM: 137 meq/L (ref 135–145)
Total Protein: 6.8 g/dL (ref 6.0–8.3)

## 2017-01-05 LAB — CBC
HCT: 33.6 % — ABNORMAL LOW (ref 36.0–46.0)
HEMOGLOBIN: 10.9 g/dL — AB (ref 12.0–15.0)
MCHC: 32.5 g/dL (ref 30.0–36.0)
MCV: 78.2 fl (ref 78.0–100.0)
PLATELETS: 314 10*3/uL (ref 150.0–400.0)
RBC: 4.3 Mil/uL (ref 3.87–5.11)
RDW: 17.4 % — ABNORMAL HIGH (ref 11.5–15.5)
WBC: 7.5 10*3/uL (ref 4.0–10.5)

## 2017-01-05 LAB — HEMOGLOBIN A1C: HEMOGLOBIN A1C: 5.7 % (ref 4.6–6.5)

## 2017-01-05 LAB — LIPID PANEL
CHOLESTEROL: 165 mg/dL (ref 0–200)
HDL: 62.6 mg/dL (ref >39.00)
LDL Cholesterol: 80 mg/dL (ref 0–99)
NONHDL: 101.96
Total CHOL/HDL Ratio: 3
Triglycerides: 110 mg/dL (ref 0.0–149.0)
VLDL: 22 mg/dL (ref 0.0–40.0)

## 2017-01-05 LAB — TSH: TSH: 0.47 u[IU]/mL (ref 0.35–4.50)

## 2017-01-05 MED ORDER — LINACLOTIDE 145 MCG PO CAPS: 145.0000 ug | ORAL_CAPSULE | Freq: Every day | ORAL | 0 refills | Status: DC

## 2017-01-05 MED ORDER — SACCHAROMYCES BOULARDII 250 MG PO CAPS: 250.0000 mg | ORAL_CAPSULE | Freq: Two times a day (BID) | ORAL | Status: DC

## 2017-01-05 MED ORDER — LISINOPRIL 20 MG PO TABS: 40.0000 mg | ORAL_TABLET | Freq: Every day | ORAL | 1 refills | Status: DC

## 2017-01-05 NOTE — Patient Instructions (Addendum)
Look into FODMAP diet Encourage adequate oral hydration and fiber intake.  Sister with multiple brain aneurysm (possibly familial?). According to the American Stroke Association; she has 0.8% risk. But screening is recommended only if there are 2 or more first degree relatives with multiple brain aneurysms.  She is asymptomatic at this time. Will wait of ordering MRA or CTA at this time.  DASH Eating Plan DASH stands for "Dietary Approaches to Stop Hypertension." The DASH eating plan is a healthy eating plan that has been shown to reduce high blood pressure (hypertension). It may also reduce your risk for type 2 diabetes, heart disease, and stroke. The DASH eating plan may also help with weight loss. What are tips for following this plan? General guidelines  Avoid eating more than 2,300 mg (milligrams) of salt (sodium) a day. If you have hypertension, you may need to reduce your sodium intake to 1,500 mg a day.  Limit alcohol intake to no more than 1 drink a day for nonpregnant women and 2 drinks a day for men. One drink equals 12 oz of beer, 5 oz of wine, or 1 oz of hard liquor.  Work with your health care provider to maintain a healthy body weight or to lose weight. Ask what an ideal weight is for you.  Get at least 30 minutes of exercise that causes your heart to beat faster (aerobic exercise) most days of the week. Activities may include walking, swimming, or biking.  Work with your health care provider or diet and nutrition specialist (dietitian) to adjust your eating plan to your individual calorie needs. Reading food labels  Check food labels for the amount of sodium per serving. Choose foods with less than 5 percent of the Daily Value of sodium. Generally, foods with less than 300 mg of sodium per serving fit into this eating plan.  To find whole grains, look for the word "whole" as the first word in the ingredient list. Shopping  Buy products labeled as "low-sodium" or "no  salt added."  Buy fresh foods. Avoid canned foods and premade or frozen meals. Cooking  Avoid adding salt when cooking. Use salt-free seasonings or herbs instead of table salt or sea salt. Check with your health care provider or pharmacist before using salt substitutes.  Do not fry foods. Cook foods using healthy methods such as baking, boiling, grilling, and broiling instead.  Cook with heart-healthy oils, such as olive, canola, soybean, or sunflower oil. Meal planning   Eat a balanced diet that includes: ? 5 or more servings of fruits and vegetables each day. At each meal, try to fill half of your plate with fruits and vegetables. ? Up to 6-8 servings of whole grains each day. ? Less than 6 oz of lean meat, poultry, or fish each day. A 3-oz serving of meat is about the same size as a deck of cards. One egg equals 1 oz. ? 2 servings of low-fat dairy each day. ? A serving of nuts, seeds, or beans 5 times each week. ? Heart-healthy fats. Healthy fats called Omega-3 fatty acids are found in foods such as flaxseeds and coldwater fish, like sardines, salmon, and mackerel.  Limit how much you eat of the following: ? Canned or prepackaged foods. ? Food that is high in trans fat, such as fried foods. ? Food that is high in saturated fat, such as fatty meat. ? Sweets, desserts, sugary drinks, and other foods with added sugar. ? Full-fat dairy products.  Do not salt  foods before eating.  Try to eat at least 2 vegetarian meals each week.  Eat more home-cooked food and less restaurant, buffet, and fast food.  When eating at a restaurant, ask that your food be prepared with less salt or no salt, if possible. What foods are recommended? The items listed may not be a complete list. Talk with your dietitian about what dietary choices are best for you. Grains Whole-grain or whole-wheat bread. Whole-grain or whole-wheat pasta. Brown rice. [place]. Bulgur. Whole-grain and low-sodium  cereals. Pita bread. Low-fat, low-sodium crackers. Whole-wheat flour tortillas. Vegetables Fresh or frozen vegetables (raw, steamed, roasted, or grilled). Low-sodium or reduced-sodium tomato and vegetable juice. Low-sodium or reduced-sodium tomato sauce and tomato paste. Low-sodium or reduced-sodium canned vegetables. Fruits All fresh, dried, or frozen fruit. Canned fruit in natural juice (without added sugar). Meat and other protein foods Skinless chicken or [place]. Ground chicken or [place]. Pork with fat trimmed off. Fish and seafood. Egg whites. Dried beans, peas, or lentils. Unsalted nuts, nut butters, and seeds. Unsalted canned beans. Lean cuts of beef with fat trimmed off. Low-sodium, lean deli meat. Dairy Low-fat (1%) or fat-free (skim) milk. Fat-free, low-fat, or reduced-fat cheeses. Nonfat, low-sodium ricotta or cottage cheese. Low-fat or nonfat yogurt. Low-fat, low-sodium cheese. Fats and oils Soft margarine without trans fats. Vegetable oil. Low-fat, reduced-fat, or light mayonnaise and salad dressings (reduced-sodium). Canola, safflower, olive, soybean, and sunflower oils. Avocado. Seasoning and other foods Herbs. Spices. Seasoning mixes without salt. Unsalted popcorn and pretzels. Fat-free sweets. What foods are not recommended? The items listed may not be a complete list. Talk with your dietitian about what dietary choices are best for you. Grains Baked goods made with fat, such as croissants, muffins, or some breads. Dry pasta or rice meal packs. Vegetables Creamed or fried vegetables. Vegetables in a cheese sauce. Regular canned vegetables (not low-sodium or reduced-sodium). Regular canned tomato sauce and paste (not low-sodium or reduced-sodium). Regular tomato and vegetable juice (not low-sodium or reduced-sodium). Angie Fava. Olives. Fruits Canned fruit in a light or heavy syrup. Fried fruit. Fruit in cream or butter sauce. Meat and other protein foods Fatty cuts of meat. Ribs.  Fried meat. Berniece Salines. Sausage. Bologna and other processed lunch meats. Salami. Fatback. Hotdogs. Bratwurst. Salted nuts and seeds. Canned beans with added salt. Canned or smoked fish. Whole eggs or egg yolks. Chicken or [place] with skin. Dairy Whole or 2% milk, cream, and half-and-half. Whole or full-fat cream cheese. Whole-fat or sweetened yogurt. Full-fat cheese. Nondairy creamers. Whipped toppings. Processed cheese and cheese spreads. Fats and oils Butter. Stick margarine. Lard. Shortening. Ghee. Bacon fat. Tropical oils, such as coconut, palm kernel, or palm oil. Seasoning and other foods Salted popcorn and pretzels. Onion salt, garlic salt, seasoned salt, table salt, and sea salt. Worcestershire sauce. Tartar sauce. Barbecue sauce. Teriyaki sauce. Soy sauce, including reduced-sodium. Steak sauce. Canned and packaged gravies. Fish sauce. Oyster sauce. Cocktail sauce. Horseradish that you find on the shelf. Ketchup. Mustard. Meat flavorings and tenderizers. Bouillon cubes. Hot sauce and Tabasco sauce. Premade or packaged marinades. Premade or packaged taco seasonings. Relishes. Regular salad dressings. Where to find more information:  National Heart, Lung, and [place]: https://wilson-eaton.com/  American Heart Association: www.heart.org Summary  The DASH eating plan is a healthy eating plan that has been shown to reduce high blood pressure (hypertension). It may also reduce your risk for type 2 diabetes, heart disease, and stroke.  With the DASH eating plan, you should limit salt (sodium) intake to 2,300  mg a day. If you have hypertension, you may need to reduce your sodium intake to 1,500 mg a day.  When on the DASH eating plan, aim to eat more fresh fruits and vegetables, whole grains, lean proteins, low-fat dairy, and heart-healthy fats.  Work with your health care provider or diet and nutrition specialist (dietitian) to adjust your eating plan to your individual calorie needs. This  information is not intended to replace advice given to you by your health care provider. Make sure you discuss any questions you have with your health care provider. Document Released: 04/02/2011 Document Revised: 04/06/2016 Document Reviewed: 04/06/2016 Elsevier Interactive Patient Education  2017 [employer].

## 2017-01-05 NOTE — Progress Notes (Signed)
Subjective:    Patient ID: Jane DonningJacqueline Thole, female    DOB: January 10, 1971, 46 y.o.   MRN: [id number]  Patient presents today for complete physical   Constipation  This is a chronic problem. The current episode started more than 1 year ago. The problem has been waxing and waning since onset. Her stool frequency is 2 to 3 times per week. The stool is described as pellet like. The patient is not on a high fiber diet. She does not exercise regularly. There has not been adequate water intake. Associated symptoms include abdominal pain, bloating and hemorrhoids. Pertinent negatives include no anorexia, diarrhea, difficulty urinating, fecal incontinence, fever, flatus, hematochezia, melena, nausea, rectal pain, vomiting or weight loss. Risk factors include obesity and stress. She has tried laxatives for the symptoms. The treatment provided mild relief. Her past medical history is significant for irritable bowel syndrome.  colonoscopy done 09/2015 (normal), recommended repeat in 933yrs.  HTN: Uncontrolled.  She is hesitatnt about adding another medication at this time, but has not made any lifestyle changes. BP Readings from Last 3 Encounters:  01/05/17 (!) 160/94  11/20/16 (!) 152/88  10/20/16 (!) 190/100   Reports FH of brian aneurysm (sister). She was told it was hereditary. She will like to know if head CT is recommended to screen.  Immunizations: (TDAP, Hep C screen, Pneumovax, Influenza, zoster)  Health Maintenance  Topic Date Due  . HIV Screening  02/11/1987  . Pap Smear  08/09/2016  . Flu Shot  11/25/2016  . Tetanus Vaccine  04/24/2024   Diet:regular.  Weight:  Wt Readings from Last 3 Encounters:  01/05/17 195 lb (88.5 kg)  11/20/16 194 lb (88 kg)  10/20/16 196 lb (88.9 kg)    Exercise:none   Fall Risk: Fall Risk  01/05/2017  Falls in the past year? No   Home Safety:home with daughter, fire alarm, use of seatbelt.  Depression/Suicide: Depression screen PHQ 2/9 01/05/2017    Decreased Interest 0  Down, Depressed, Hopeless 0  PHQ - 2 Score 0   Vision:needed, no insurance Dental:up to date.  Advanced Directive: Advanced Directives 10/14/2015  Does Patient Have a Medical Advance Directive? No  Would patient like information on creating a medical advance directive? No - patient declined information   Medications and allergies reviewed with patient and updated if appropriate.  Patient Active Problem List   Diagnosis Date Noted  . Family history of aneurysm of blood vessel of brain 01/05/2017  . Encounter for Papanicolaou smear for cervical cancer screening 01/05/2017  . Hyperglycemia 01/05/2017  . Hematochezia 09/06/2015  . PMDD (premenstrual dysphoric disorder) 09/06/2015  . Menstrual migraine 09/06/2015  . Hemorrhoid 11/06/2014  . Essential hypertension 04/25/2014    Current Outpatient Prescriptions on File Prior to Visit  Medication Sig Dispense Refill  . amLODipine (NORVASC) 10 MG tablet Take 1 tablet (10 mg total) by mouth at bedtime. 90 tablet 0   No current facility-administered medications on file prior to visit.     Past Medical History:  Diagnosis Date  . Hypertension   . Migraines     Past Surgical History:  Procedure Laterality Date  . CESAREAN SECTION    . TONSILLECTOMY    . TUBAL LIGATION      Social History   Social History  . Marital status: Married    Spouse name: N/A  . Number of children: 3  . Years of education: N/A   Occupational History  . certified nursing assistant    Social History Main  Topics  . Smoking status: Former Smoker    Years: 15.00    Types: Cigarettes    Quit date: 10/02/2000  . Smokeless tobacco: Never Used  . Alcohol use No  . Drug use: No  . Sexual activity: Yes    Birth control/ protection: Surgical   Other Topics Concern  . None   Social History Narrative  . None    Family History  Problem Relation Age of Onset  . Hypertension Mother   . Heart disease Mother   . Diabetes  Mother   . Hypertension Father   . Anuerysm Father        brain  . Diabetes Maternal Grandmother   . Anuerysm Sister        brain        Review of Systems  Constitutional: Negative for fever, malaise/fatigue and weight loss.  HENT: Negative for congestion and sore throat.   Eyes:       Negative for visual changes  Respiratory: Negative for cough and shortness of breath.   Cardiovascular: Negative for chest pain, palpitations and leg swelling.  Gastrointestinal: Positive for abdominal pain, bloating, constipation and hemorrhoids. Negative for anorexia, blood in stool, diarrhea, flatus, heartburn, hematochezia, melena, nausea, rectal pain and vomiting.  Genitourinary: Negative for difficulty urinating, dysuria, frequency and urgency.  Musculoskeletal: Negative for falls, joint pain and myalgias.  Skin: Negative for rash.  Neurological: Negative for dizziness, sensory change and headaches.  Endo/Heme/Allergies: Does not bruise/bleed easily.  Psychiatric/Behavioral: Negative for depression, substance abuse and suicidal ideas. The patient is not nervous/anxious.     Objective:   Vitals:   01/05/17 1553  BP: (!) 160/94  Pulse: 84  Temp: 98 F (36.7 C)  SpO2: 95%    Body mass index is 35.67 kg/m.   Physical Examination:  Physical Exam  Constitutional: She is oriented to person, place, and time and well-developed, well-nourished, and in no distress. No distress.  HENT:  Right Ear: External ear normal.  Left Ear: External ear normal.  Nose: Nose normal.  Mouth/Throat: No oropharyngeal exudate.  Eyes: Pupils are equal, round, and reactive to light. Conjunctivae and EOM are normal. No scleral icterus.  Neck: Normal range of motion. Neck supple. No thyromegaly present.  Cardiovascular: Normal rate, regular rhythm, normal heart sounds and intact distal pulses.   Pulmonary/Chest: Effort normal and breath sounds normal. Right breast exhibits no mass, no nipple discharge and no  skin change. Left breast exhibits no mass, no nipple discharge and no skin change. Breasts are symmetrical.  Abdominal: Soft. Bowel sounds are normal. She exhibits no distension. There is no tenderness.  Genitourinary: Rectum normal, vagina normal, uterus normal, cervix normal, right adnexa normal, left adnexa normal and vulva normal. Cervix exhibits no motion tenderness. No vaginal discharge found.  Musculoskeletal: Normal range of motion. She exhibits no edema or tenderness.  Lymphadenopathy:    She has no cervical adenopathy.  Neurological: She is alert and oriented to person, place, and time. Gait normal.  Skin: Skin is warm and dry.  Psychiatric: Affect and judgment normal.  Vitals reviewed.   ASSESSMENT and PLAN:  Karlita was seen today for annual exam.  Diagnoses and all orders for this visit:  Encounter for preventative adult health care exam with abnormal findings -     CBC; Future -     Comprehensive metabolic panel; Future -     Lipid panel; Future -     TSH; Future -     Hemoglobin A1c;  Future -     Cancel: HIV antibody; Future -     HIV antibody; Future  Essential hypertension -     lisinopril (PRINIVIL,ZESTRIL) 20 MG tablet; Take 2 tablets (40 mg total) by mouth daily. TAKE 1 TABLET EVERY DAY  Encounter for lipid screening for cardiovascular disease -     Lipid panel; Future  Hyperglycemia -     Hemoglobin A1c; Future  Encounter for screening for human immunodeficiency virus (HIV) -     Cancel: HIV antibody; Future -     HIV antibody; Future  Family history of aneurysm of blood vessel of brain  Chronic idiopathic constipation -     linaclotide (LINZESS) 145 MCG CAPS capsule; Take 1 capsule (145 mcg total) by mouth daily before breakfast. -     saccharomyces boulardii (FLORASTOR) 250 MG capsule; Take 1 capsule (250 mg total) by mouth 2 (two) times daily.  Encounter for Papanicolaou smear for cervical cancer screening -     Cytology - PAP; Future  Need  for influenza vaccination -     Flu Vaccine QUAD 36+ mos IM  Other orders -     Cancel: Cytology - PAP; Future    Family history of aneurysm of blood vessel of brain Sister with multiple brain aneurysm (possibly familial?). According to the American Stroke Association; she has 0.8% risk. But screening is recommended only if there are 2 or more first degree relatives with multiple brain aneurysms.  She is asymptomatic at this time. Will wait of ordering MRA or CTA at this time.      Follow up: Return in about 4 weeks (around 02/02/2017) for HTN and constipation.  Alysia Penna, NP

## 2017-01-05 NOTE — Assessment & Plan Note (Addendum)
Sister with multiple brain aneurysm (possibly familial?). According to the American Stroke Association; she has 0.8% risk. But screening is recommended only if there are 2 or more first degree relatives with multiple brain aneurysms.  She is asymptomatic at this time. Will wait of ordering MRA or CTA at this time.

## 2017-01-06 LAB — HIV ANTIBODY (ROUTINE TESTING W REFLEX): HIV: NONREACTIVE

## 2017-01-07 ENCOUNTER — Telehealth: Payer: Self-pay | Admitting: Internal Medicine

## 2017-01-07 DIAGNOSIS — K5904 Chronic idiopathic constipation: Secondary | ICD-10-CM

## 2017-01-07 LAB — CYTOLOGY - PAP: Diagnosis: NEGATIVE

## 2017-01-07 MED ORDER — LINACLOTIDE 145 MCG PO CAPS: 145.0000 ug | ORAL_CAPSULE | Freq: Every day | ORAL | 0 refills | Status: DC

## 2017-01-07 NOTE — Telephone Encounter (Signed)
Ok to take multivitamin with iron or multivitamin once a day and ferrous sulfate  twice a day (with food)

## 2017-01-07 NOTE — Telephone Encounter (Addendum)
Pt was notified of message and was given lab results, she would like to know if charlotte wants her to take Iron supplements as well, she has forgotten to mention to charlotte she has heavy mensus and this may be causing low iron. Please advise  She would like for us to leave her a voicemail with the instructions for her,  she is at work.

## 2017-01-07 NOTE — Telephone Encounter (Signed)
Pt states Linzess is working she would like a Rx. linaclotide (LINZESS) 145 MCG CAPS capsule  [facility] [address]

## 2017-01-07 NOTE — Telephone Encounter (Signed)
rx sent. Left vm for pt to call back, need to inform about lab result as well.

## 2017-01-07 NOTE — Telephone Encounter (Signed)
Patient called back.  I have given charlottes response to labs.

## 2017-01-07 NOTE — Telephone Encounter (Signed)
Left detail massage for pt with directions.   Take multivitamin that has iron in it once a day or multivitamin once a day and iron 325 mg 2 times daily.

## 2017-02-02 ENCOUNTER — Ambulatory Visit (INDEPENDENT_AMBULATORY_CARE_PROVIDER_SITE_OTHER): Payer: BLUE CROSS/BLUE SHIELD | Admitting: Nurse Practitioner

## 2017-02-02 ENCOUNTER — Encounter: Payer: Self-pay | Admitting: Nurse Practitioner

## 2017-02-02 DIAGNOSIS — I10 Essential (primary) hypertension: Secondary | ICD-10-CM

## 2017-02-02 DIAGNOSIS — K5904 Chronic idiopathic constipation: Secondary | ICD-10-CM

## 2017-02-02 MED ORDER — AMLODIPINE BESYLATE 10 MG PO TABS: 10.0000 mg | ORAL_TABLET | Freq: Every day | ORAL | 1 refills | Status: DC

## 2017-02-02 MED ORDER — LINACLOTIDE 145 MCG PO CAPS: 145.0000 ug | ORAL_CAPSULE | Freq: Every day | ORAL | 1 refills | Status: DC

## 2017-02-02 NOTE — Progress Notes (Signed)
Subjective:  Patient ID: Jane Hampton, female    DOB: 02-13-1971  Age: 46 y.o. MRN: [id number]  CC: Follow-up (4 wk follow up--linezz refills? medication consult)   HPI  HTN: Improved with amlodipine and lisinopril. Denies any adverse side effects. BP Readings from Last 3 Encounters:  02/02/17 130/88  01/05/17 (!) 160/94  11/20/16 (!) 152/88   Constipation: Improved with linzess. Need refill.  Outpatient Medications Prior to Visit  Medication Sig Dispense Refill  . amoxicillin (AMOXIL) 500 MG capsule   0  . ibuprofen (ADVIL,MOTRIN) 800 MG tablet   0  . lisinopril (PRINIVIL,ZESTRIL) 20 MG tablet Take 2 tablets (40 mg total) by mouth daily. TAKE 1 TABLET EVERY DAY 180 tablet 1  . saccharomyces boulardii (FLORASTOR) 250 MG capsule Take 1 capsule (250 mg total) by mouth 2 (two) times daily.    Marland Kitchen amLODipine (NORVASC) 10 MG tablet Take 1 tablet (10 mg total) by mouth at bedtime. 90 tablet 0  . linaclotide (LINZESS) 145 MCG CAPS capsule Take 1 capsule (145 mcg total) by mouth daily before breakfast. 30 capsule 0   No facility-administered medications prior to visit.     ROS See HPI  Objective:  BP 130/88   Pulse (!) 54   Temp 98.5 F (36.9 C)   Ht  (1.575 m)   Wt 193 lb (87.5 kg)   SpO2 99%   BMI 35.30 kg/m   BP Readings from Last 3 Encounters:  02/02/17 130/88  01/05/17 (!) 160/94  11/20/16 (!) 152/88    Wt Readings from Last 3 Encounters:  02/02/17 193 lb (87.5 kg)  01/05/17 195 lb (88.5 kg)  11/20/16 194 lb (88 kg)    Physical Exam  Constitutional: She is oriented to person, place, and time. No distress.  Cardiovascular: Normal rate and regular rhythm.   Pulmonary/Chest: Effort normal and breath sounds normal.  Musculoskeletal: She exhibits no edema.  Neurological: She is alert and oriented to person, place, and time.  Skin: Skin is warm and dry.  Vitals reviewed.   Lab Results  Component Value Date   WBC 7.5 01/05/2017   HGB 10.9 (L)  01/05/2017   HCT 33.6 (L) 01/05/2017   PLT 314.0 01/05/2017   GLUCOSE 89 01/05/2017   CHOL 165 01/05/2017   TRIG 110.0 01/05/2017   HDL 62.60 01/05/2017   LDLCALC 80 01/05/2017   ALT 18 01/05/2017   AST 24 01/05/2017   NA 137 01/05/2017   K 3.6 01/05/2017   CL 104 01/05/2017   CREATININE 1.03 01/05/2017   BUN 15 01/05/2017   CO2 24 01/05/2017   TSH 0.47 01/05/2017   HGBA1C 5.7 01/05/2017   MICROALBUR 4.40 (H) 08/24/2008    No results found.  Assessment & Plan:   Jane Hampton was seen today for follow-up.  Diagnoses and all orders for this visit:  Essential hypertension -     amLODipine (NORVASC) 10 MG tablet; Take 1 tablet (10 mg total) by mouth at bedtime.  Chronic idiopathic constipation -     linaclotide (LINZESS) 145 MCG CAPS capsule; Take 1 capsule (145 mcg total) by mouth daily before breakfast.   I am having Jane Hampton maintain her amoxicillin, ibuprofen, lisinopril, saccharomyces boulardii, amLODipine, and linaclotide.  Meds ordered this encounter  Medications  . amLODipine (NORVASC) 10 MG tablet    Sig: Take 1 tablet (10 mg total) by mouth at bedtime.    Dispense:  90 tablet    Refill:  1    Order Specific  Question:   Supervising Provider    Answer:   Tresa Garter [[id number]]  . linaclotide (LINZESS) 145 MCG CAPS capsule    Sig: Take 1 capsule (145 mcg total) by mouth daily before breakfast.    Dispense:  90 capsule    Refill:  1    Order Specific Question:   Supervising Provider    Answer:   Tresa Garter [[id number]]    Follow-up: Return in about 6 months (around 08/03/2017) for HTN, hyperlipidemia at [place] location.  Alysia Penna, NP

## 2017-02-02 NOTE — Patient Instructions (Signed)
[address] [place], [place]   DASH Eating Plan DASH stands for "Dietary Approaches to Stop Hypertension." The DASH eating plan is a healthy eating plan that has been shown to reduce high blood pressure (hypertension). It may also reduce your risk for type 2 diabetes, heart disease, and stroke. The DASH eating plan may also help with weight loss. What are tips for following this plan? General guidelines  Avoid eating more than 2,300 mg (milligrams) of salt (sodium) a day. If you have hypertension, you may need to reduce your sodium intake to 1,500 mg a day.  Limit alcohol intake to no more than 1 drink a day for nonpregnant women and 2 drinks a day for men. One drink equals 12 oz of beer, 5 oz of wine, or 1 oz of hard liquor.  Work with your health care provider to maintain a healthy body weight or to lose weight. Ask what an ideal weight is for you.  Get at least 30 minutes of exercise that causes your heart to beat faster (aerobic exercise) most days of the week. Activities may include walking, swimming, or biking.  Work with your health care provider or diet and nutrition specialist (dietitian) to adjust your eating plan to your individual calorie needs. Reading food labels  Check food labels for the amount of sodium per serving. Choose foods with less than 5 percent of the Daily Value of sodium. Generally, foods with less than 300 mg of sodium per serving fit into this eating plan.  To find whole grains, look for the word "whole" as the first word in the ingredient list. Shopping  Buy products labeled as "low-sodium" or "no salt added."  Buy fresh foods. Avoid canned foods and premade or frozen meals. Cooking  Avoid adding salt when cooking. Use salt-free seasonings or herbs instead of table salt or sea salt. Check with your health care provider or pharmacist before using salt substitutes.  Do not fry foods. Cook foods using healthy methods such as baking, boiling,  grilling, and broiling instead.  Cook with heart-healthy oils, such as olive, canola, soybean, or sunflower oil. Meal planning   Eat a balanced diet that includes: ? 5 or more servings of fruits and vegetables each day. At each meal, try to fill half of your plate with fruits and vegetables. ? Up to 6-8 servings of whole grains each day. ? Less than 6 oz of lean meat, poultry, or fish each day. A 3-oz serving of meat is about the same size as a deck of cards. One egg equals 1 oz. ? 2 servings of low-fat dairy each day. ? A serving of nuts, seeds, or beans 5 times each week. ? Heart-healthy fats. Healthy fats called Omega-3 fatty acids are found in foods such as flaxseeds and coldwater fish, like sardines, salmon, and mackerel.  Limit how much you eat of the following: ? Canned or prepackaged foods. ? Food that is high in trans fat, such as fried foods. ? Food that is high in saturated fat, such as fatty meat. ? Sweets, desserts, sugary drinks, and other foods with added sugar. ? Full-fat dairy products.  Do not salt foods before eating.  Try to eat at least 2 vegetarian meals each week.  Eat more home-cooked food and less restaurant, buffet, and fast food.  When eating at a restaurant, ask that your food be prepared with less salt or no salt, if possible. What foods are recommended? The items listed may not be a  complete list. Talk with your dietitian about what dietary choices are best for you. Grains Whole-grain or whole-wheat bread. Whole-grain or whole-wheat pasta. Brown rice. Modena Morrow. Bulgur. Whole-grain and low-sodium cereals. Pita bread. Low-fat, low-sodium crackers. Whole-wheat flour tortillas. Vegetables Fresh or frozen vegetables (raw, steamed, roasted, or grilled). Low-sodium or reduced-sodium tomato and vegetable juice. Low-sodium or reduced-sodium tomato sauce and tomato paste. Low-sodium or reduced-sodium canned vegetables. Fruits All fresh, dried, or frozen  fruit. Canned fruit in natural juice (without added sugar). Meat and other protein foods Skinless chicken or [place]. Ground chicken or [place]. Pork with fat trimmed off. Fish and seafood. Egg whites. Dried beans, peas, or lentils. Unsalted nuts, nut butters, and seeds. Unsalted canned beans. Lean cuts of beef with fat trimmed off. Low-sodium, lean deli meat. Dairy Low-fat (1%) or fat-free (skim) milk. Fat-free, low-fat, or reduced-fat cheeses. Nonfat, low-sodium ricotta or cottage cheese. Low-fat or nonfat yogurt. Low-fat, low-sodium cheese. Fats and oils Soft margarine without trans fats. Vegetable oil. Low-fat, reduced-fat, or light mayonnaise and salad dressings (reduced-sodium). Canola, safflower, olive, soybean, and sunflower oils. Avocado. Seasoning and other foods Herbs. Spices. Seasoning mixes without salt. Unsalted popcorn and pretzels. Fat-free sweets. What foods are not recommended? The items listed may not be a complete list. Talk with your dietitian about what dietary choices are best for you. Grains Baked goods made with fat, such as croissants, muffins, or some breads. Dry pasta or rice meal packs. Vegetables Creamed or fried vegetables. Vegetables in a cheese sauce. Regular canned vegetables (not low-sodium or reduced-sodium). Regular canned tomato sauce and paste (not low-sodium or reduced-sodium). Regular tomato and vegetable juice (not low-sodium or reduced-sodium). Angie Fava. Olives. Fruits Canned fruit in a light or heavy syrup. Fried fruit. Fruit in cream or butter sauce. Meat and other protein foods Fatty cuts of meat. Ribs. Fried meat. Berniece Salines. Sausage. Bologna and other processed lunch meats. Salami. Fatback. Hotdogs. Bratwurst. Salted nuts and seeds. Canned beans with added salt. Canned or smoked fish. Whole eggs or egg yolks. Chicken or [place] with skin. Dairy Whole or 2% milk, cream, and half-and-half. Whole or full-fat cream cheese. Whole-fat or sweetened yogurt. Full-fat  cheese. Nondairy creamers. Whipped toppings. Processed cheese and cheese spreads. Fats and oils Butter. Stick margarine. Lard. Shortening. Ghee. Bacon fat. Tropical oils, such as coconut, palm kernel, or palm oil. Seasoning and other foods Salted popcorn and pretzels. Onion salt, garlic salt, seasoned salt, table salt, and sea salt. Worcestershire sauce. Tartar sauce. Barbecue sauce. Teriyaki sauce. Soy sauce, including reduced-sodium. Steak sauce. Canned and packaged gravies. Fish sauce. Oyster sauce. Cocktail sauce. Horseradish that you find on the shelf. Ketchup. Mustard. Meat flavorings and tenderizers. Bouillon cubes. Hot sauce and Tabasco sauce. Premade or packaged marinades. Premade or packaged taco seasonings. Relishes. Regular salad dressings. Where to find more information:  National Heart, Lung, and [place]: https://wilson-eaton.com/  American Heart Association: www.heart.org Summary  The DASH eating plan is a healthy eating plan that has been shown to reduce high blood pressure (hypertension). It may also reduce your risk for type 2 diabetes, heart disease, and stroke.  With the DASH eating plan, you should limit salt (sodium) intake to 2,300 mg a day. If you have hypertension, you may need to reduce your sodium intake to 1,500 mg a day.  When on the DASH eating plan, aim to eat more fresh fruits and vegetables, whole grains, lean proteins, low-fat dairy, and heart-healthy fats.  Work with your health care provider or diet and nutrition specialist (dietitian)  to adjust your eating plan to your individual calorie needs. This information is not intended to replace advice given to you by your health care provider. Make sure you discuss any questions you have with your health care provider. Document Released: 04/02/2011 Document Revised: 04/06/2016 Document Reviewed: 04/06/2016 Elsevier Interactive Patient Education  2017 [employer].

## 2017-03-08 ENCOUNTER — Other Ambulatory Visit: Payer: Self-pay | Admitting: Nurse Practitioner

## 2017-03-08 DIAGNOSIS — I10 Essential (primary) hypertension: Secondary | ICD-10-CM

## 2017-03-08 MED ORDER — LISINOPRIL 20 MG PO TABS: 20.0000 mg | ORAL_TABLET | Freq: Every day | ORAL | 1 refills | Status: DC

## 2017-04-08 ENCOUNTER — Other Ambulatory Visit: Payer: Self-pay | Admitting: Nurse Practitioner

## 2017-04-08 DIAGNOSIS — Z1231 Encounter for screening mammogram for malignant neoplasm of breast: Secondary | ICD-10-CM

## 2017-05-03 ENCOUNTER — Encounter: Payer: Self-pay | Admitting: Internal Medicine

## 2017-05-07 ENCOUNTER — Ambulatory Visit
Admission: RE | Admit: 2017-05-07 | Discharge: 2017-05-07 | Disposition: A | Discharge status: Home / Self Care | Payer: BLUE CROSS/BLUE SHIELD | Source: Ambulatory Visit | Attending: Nurse Practitioner | Admitting: Nurse Practitioner

## 2017-05-07 DIAGNOSIS — Z1231 Encounter for screening mammogram for malignant neoplasm of breast: Secondary | ICD-10-CM

## 2017-07-26 ENCOUNTER — Telehealth: Payer: Self-pay | Admitting: Nurse Practitioner

## 2017-07-26 DIAGNOSIS — K5904 Chronic idiopathic constipation: Secondary | ICD-10-CM

## 2017-07-26 DIAGNOSIS — I10 Essential (primary) hypertension: Secondary | ICD-10-CM

## 2017-07-26 NOTE — Telephone Encounter (Signed)
Copied from CRM [id number]. Topic: General - Other >> Jul 26, 2017  2:32 PM Cecelia ByarsGreen, Ranjit Ashurst L, RMA wrote: Reason for CRM: Medication refill request for linaclotide (LINZESS) 145 MCG CAPS to [facility] [address]

## 2017-07-27 MED ORDER — AMLODIPINE BESYLATE 10 MG PO TABS: 10.0000 mg | ORAL_TABLET | Freq: Every day | ORAL | 0 refills | Status: DC

## 2017-07-27 MED ORDER — LINACLOTIDE 145 MCG PO CAPS: 145.0000 ug | ORAL_CAPSULE | Freq: Every day | ORAL | 0 refills | Status: DC

## 2017-07-27 NOTE — Telephone Encounter (Signed)
[facility] for Linzess and amlodipine. Need to see PCP for more refills.

## 2017-07-27 NOTE — Telephone Encounter (Signed)
LOV  02/02/17 Jane Hampton

## 2017-07-29 NOTE — Telephone Encounter (Signed)
Pt called to ask if the office received the PA form  From [facility] for Lin[facility]ess

## 2017-07-30 NOTE — Telephone Encounter (Signed)
Let pt know we are just waiting to hear back on the PA. Would info as soon as we heard. TLG

## 2017-08-05 NOTE — Telephone Encounter (Signed)
Spoke with pt and informed them that the PA was denied and we are now working on the appeal. TLG

## 2017-08-05 NOTE — Telephone Encounter (Signed)
Patient called to check the status of her Linzess medication. Did advise patient that we were still waiting on the PA

## 2017-09-07 ENCOUNTER — Other Ambulatory Visit: Payer: Self-pay

## 2017-09-07 ENCOUNTER — Ambulatory Visit (HOSPITAL_COMMUNITY)
Admission: EM | Admit: 2017-09-07 | Discharge: 2017-09-07 | Disposition: A | Discharge status: Home / Self Care | Payer: BLUE CROSS/BLUE SHIELD | Attending: Family Medicine | Admitting: Family Medicine

## 2017-09-07 ENCOUNTER — Encounter (HOSPITAL_COMMUNITY): Payer: Self-pay | Admitting: Emergency Medicine

## 2017-09-07 DIAGNOSIS — Z9889 Other specified postprocedural states: Secondary | ICD-10-CM | POA: Diagnosis not present

## 2017-09-07 DIAGNOSIS — Z79899 Other long term (current) drug therapy: Secondary | ICD-10-CM | POA: Insufficient documentation

## 2017-09-07 DIAGNOSIS — N3001 Acute cystitis with hematuria: Secondary | ICD-10-CM | POA: Insufficient documentation

## 2017-09-07 DIAGNOSIS — Z791 Long term (current) use of non-steroidal anti-inflammatories (NSAID): Secondary | ICD-10-CM | POA: Diagnosis not present

## 2017-09-07 DIAGNOSIS — I10 Essential (primary) hypertension: Secondary | ICD-10-CM | POA: Diagnosis not present

## 2017-09-07 DIAGNOSIS — Z3202 Encounter for pregnancy test, result negative: Secondary | ICD-10-CM

## 2017-09-07 DIAGNOSIS — Z8249 Family history of ischemic heart disease and other diseases of the circulatory system: Secondary | ICD-10-CM | POA: Insufficient documentation

## 2017-09-07 DIAGNOSIS — R1030 Lower abdominal pain, unspecified: Secondary | ICD-10-CM

## 2017-09-07 DIAGNOSIS — Z833 Family history of diabetes mellitus: Secondary | ICD-10-CM | POA: Insufficient documentation

## 2017-09-07 DIAGNOSIS — Z87891 Personal history of nicotine dependence: Secondary | ICD-10-CM | POA: Diagnosis not present

## 2017-09-07 LAB — POCT URINALYSIS DIP (DEVICE)
Glucose, UA: NEGATIVE mg/dL
Nitrite: NEGATIVE
PH: 5.5 (ref 5.0–8.0)
Protein, ur: 100 mg/dL — AB
Urobilinogen, UA: 0.2 mg/dL (ref 0.0–1.0)

## 2017-09-07 LAB — POCT PREGNANCY, URINE: Preg Test, Ur: NEGATIVE

## 2017-09-07 MED ORDER — NITROFURANTOIN MONOHYD MACRO 100 MG PO CAPS: 100.0000 mg | ORAL_CAPSULE | Freq: Two times a day (BID) | ORAL | 0 refills | Status: AC

## 2017-09-07 NOTE — ED Triage Notes (Signed)
Pt reports urinary frequency, suprapubic pain with urination and blood in her urine that started last night.

## 2017-09-07 NOTE — ED Provider Notes (Addendum)
[facility]    CSN: [id number] Arrival date & time: 09/07/17  1914     History   Chief Complaint Chief Complaint  Patient presents with  . Urinary Tract Infection    HPI Jane Hampton is a 47 y.o. female.   Comes into the urgent care complaining of suprapubic pain after urination onset yesterday with urinary frequency and urinary urgency. Also reports blood in urine.   Patient denies nausea, back pain, vaginal discharge or fever. No modifying factor reported. Denies frequent history of UTI.     The history is provided by the patient.    Past Medical History:  Diagnosis Date  . Hypertension   . Migraines     Patient Active Problem List   Diagnosis Date Noted  . Family history of aneurysm of blood vessel of brain 01/05/2017  . Encounter for Papanicolaou smear for cervical cancer screening 01/05/2017  . Hyperglycemia 01/05/2017  . Hematochezia 09/06/2015  . PMDD (premenstrual dysphoric disorder) 09/06/2015  . Menstrual migraine 09/06/2015  . Hemorrhoid 11/06/2014  . Essential hypertension 04/25/2014    Past Surgical History:  Procedure Laterality Date  . CESAREAN SECTION    . TONSILLECTOMY    . TUBAL LIGATION     OB History   None    Home Medications    Prior to Admission medications   Medication Sig Start Date End Date Taking? Authorizing Provider  amLODipine (NORVASC) 10 MG tablet Take 1 tablet (10 mg total) by mouth at bedtime. 07/27/17  Yes Nche, Bonna Gains, NP  linaclotide [facility]) 145 MCG CAPS capsule Take 1 capsule (145 mcg total) by mouth daily before breakfast. 07/27/17  Yes Nche, Bonna Gains, NP  lisinopril (PRINIVIL,ZESTRIL) 20 MG tablet Take 1 tablet (20 mg total) daily by mouth. 03/08/17  Yes Nche, Bonna Gains, NP  amoxicillin (AMOXIL) 500 MG capsule  01/04/17   [provider]  ibuprofen (ADVIL,MOTRIN) 800 MG tablet  01/04/17   [provider]  nitrofurantoin, macrocrystal-monohydrate, (MACROBID) 100 MG  capsule Take 1 capsule (100 mg total) by mouth 2 (two) times daily for 5 days. 09/07/17 09/12/17  Lucia Estelle, NP  saccharomyces boulardii (FLORASTOR) 250 MG capsule Take 1 capsule (250 mg total) by mouth 2 (two) times daily. 01/05/17   Nche, Bonna Gains, NP   Family History Family History  Problem Relation Age of Onset  . Hypertension Mother   . Heart disease Mother   . Diabetes Mother   . Hypertension Father   . Anuerysm Father        brain  . Diabetes Maternal Grandmother   . Anuerysm Sister        brain    Social History Social History   Tobacco Use  . Smoking status: Former Smoker    Years: 15.00    Types: Cigarettes    Last attempt to quit: 10/02/2000    Years since quitting: 16.9  . Smokeless tobacco: Never Used  Substance Use Topics  . Alcohol use: No    Alcohol/week: 0.0 oz  . Drug use: No   Allergies   Patient has no known allergies.  Review of Systems Review of Systems  Constitutional: Negative for chills, fatigue and fever.  Respiratory: Negative for shortness of breath.   Cardiovascular: Negative for chest pain.  Gastrointestinal: Positive for abdominal pain. Negative for nausea and vomiting.  Genitourinary:       As stated in the HPI  Neurological: Negative for dizziness and headaches.    Physical Exam Triage Vital  Signs ED Triage Vitals  Enc Vitals Group     BP      Pulse      Resp      Temp      Temp src      SpO2      Weight      Height      Head Circumference      Peak Flow      Pain Score      Pain Loc      Pain Edu?      Excl. in GC?    No data found.  Updated Vital Signs BP (!) 157/93   Pulse (!) 59   Temp 97.7 F (36.5 C) (Oral)   LMP 08/24/2017 (Approximate)   SpO2 100%   Physical Exam  Constitutional: She is oriented to person, place, and time. She appears well-developed and well-nourished.  HENT:  Head: Normocephalic and atraumatic.  Cardiovascular: Normal rate.  Pulmonary/Chest: Effort normal.  Abdominal: Soft.  There is tenderness (suprapubic tenderness present).  Genitourinary:  Genitourinary Comments: Negative CVA tenderness  Neurological: She is alert and oriented to person, place, and time.  Skin: Skin is warm.  Psychiatric: She has a normal mood and affect.    UC Treatments / Results  Labs (all labs ordered are listed, but only abnormal results are displayed) Labs Reviewed  POCT URINALYSIS DIP (DEVICE) - Abnormal; Notable for the following components:      Result Value   Bilirubin Urine SMALL (*)    Ketones, ur TRACE (*)    Hgb urine dipstick LARGE (*)    Protein, ur 100 (*)    Leukocytes, UA SMALL (*)    All other components within normal limits  URINE CULTURE  POCT PREGNANCY, URINE    EKG None  Radiology No results found.  Procedures Procedures (including critical care time)  Medications Ordered in UC Medications - No data to display  Initial Impression / Assessment and Plan / UC Course  I have reviewed the triage vital signs and the nursing notes.  Pertinent labs & imaging results that were available during my care of the patient were reviewed by me and considered in my medical decision making (see chart for details).  Final Clinical Impressions(s) / UC Diagnoses   Final diagnoses:  Acute cystitis with hematuria   Prescriptions given. Culture pending. Reviewed directions for usage and side effects. Patient states understanding and will call with questions or problems. Patient instructed to call or follow up with his/her primary care doctor if failure to improve or change in symptoms. Discharge instruction given.  Please follow up with your PCP for your hypertension.   Discharge Instructions   None    ED Prescriptions    Medication Sig Dispense Auth. Provider   nitrofurantoin, macrocrystal-monohydrate, (MACROBID) 100 MG capsule Take 1 capsule (100 mg total) by mouth 2 (two) times daily for 5 days. 10 capsule Lucia Estelle, NP     Controlled Substance  Prescriptions [place] Controlled Substance Registry consulted? Not Applicable   Lucia Estelle, NP 09/07/17 1149    Lucia Estelle, NP 09/07/17 1150

## 2017-09-09 LAB — URINE CULTURE

## 2017-10-16 ENCOUNTER — Other Ambulatory Visit: Payer: Self-pay | Admitting: Nurse Practitioner

## 2017-10-16 DIAGNOSIS — K5904 Chronic idiopathic constipation: Secondary | ICD-10-CM

## 2017-11-22 ENCOUNTER — Other Ambulatory Visit: Payer: Self-pay | Admitting: Internal Medicine

## 2017-12-22 ENCOUNTER — Other Ambulatory Visit: Payer: Self-pay | Admitting: Nurse Practitioner

## 2017-12-22 DIAGNOSIS — K5904 Chronic idiopathic constipation: Secondary | ICD-10-CM

## 2017-12-23 ENCOUNTER — Other Ambulatory Visit: Payer: Self-pay | Admitting: Nurse Practitioner

## 2017-12-23 DIAGNOSIS — K5904 Chronic idiopathic constipation: Secondary | ICD-10-CM

## 2017-12-24 ENCOUNTER — Ambulatory Visit: Payer: BLUE CROSS/BLUE SHIELD | Admitting: Nurse Practitioner

## 2017-12-24 ENCOUNTER — Encounter: Payer: Self-pay | Admitting: Nurse Practitioner

## 2017-12-24 VITALS — BP 136/84 | HR 57 | Temp 98.4°F | Ht 62.0 in | Wt 183.0 lb

## 2017-12-24 DIAGNOSIS — I10 Essential (primary) hypertension: Secondary | ICD-10-CM | POA: Diagnosis not present

## 2017-12-24 DIAGNOSIS — K5904 Chronic idiopathic constipation: Secondary | ICD-10-CM | POA: Diagnosis not present

## 2017-12-24 DIAGNOSIS — R739 Hyperglycemia, unspecified: Secondary | ICD-10-CM | POA: Diagnosis not present

## 2017-12-24 DIAGNOSIS — D500 Iron deficiency anemia secondary to blood loss (chronic): Secondary | ICD-10-CM | POA: Diagnosis not present

## 2017-12-24 DIAGNOSIS — R011 Cardiac murmur, unspecified: Secondary | ICD-10-CM

## 2017-12-24 LAB — IBC PANEL
Iron: 85 ug/dL (ref 42–145)
SATURATION RATIOS: 24.1 % (ref 20.0–50.0)
Transferrin: 252 mg/dL (ref 212.0–360.0)

## 2017-12-24 LAB — CBC WITH DIFFERENTIAL/PLATELET
Basophils Absolute: 0.1 10*3/uL (ref 0.0–0.1)
Basophils Relative: 1.5 % (ref 0.0–3.0)
Eosinophils Absolute: 0.2 10*3/uL (ref 0.0–0.7)
Eosinophils Relative: 2.4 % (ref 0.0–5.0)
HCT: 40.1 % (ref 36.0–46.0)
Hemoglobin: 13.4 g/dL (ref 12.0–15.0)
Lymphocytes Relative: 34.5 % (ref 12.0–46.0)
Lymphs Abs: 2.3 10*3/uL (ref 0.7–4.0)
MCHC: 33.4 g/dL (ref 30.0–36.0)
MCV: 90.6 fl (ref 78.0–100.0)
Monocytes Absolute: 0.6 10*3/uL (ref 0.1–1.0)
Monocytes Relative: 9 % (ref 3.0–12.0)
Neutro Abs: 3.6 10*3/uL (ref 1.4–7.7)
Neutrophils Relative %: 52.6 % (ref 43.0–77.0)
Platelets: 313 10*3/uL (ref 150.0–400.0)
RBC: 4.42 Mil/uL (ref 3.87–5.11)
RDW: 13.8 % (ref 11.5–15.5)
WBC: 6.8 10*3/uL (ref 4.0–10.5)

## 2017-12-24 LAB — BASIC METABOLIC PANEL
BUN: 16 mg/dL (ref 6–23)
CHLORIDE: 105 meq/L (ref 96–112)
CO2: 26 mEq/L (ref 19–32)
CREATININE: 0.83 mg/dL (ref 0.40–1.20)
Calcium: 10.3 mg/dL (ref 8.4–10.5)
GFR: 95.24 mL/min (ref >60.00)
Glucose, Bld: 78 mg/dL (ref 70–99)
Potassium: 3.5 mEq/L (ref 3.5–5.1)
Sodium: 138 mEq/L (ref 135–145)

## 2017-12-24 LAB — TSH: TSH: 0.39 u[IU]/mL (ref 0.35–4.50)

## 2017-12-24 LAB — FERRITIN: Ferritin: 29.4 ng/mL (ref 10.0–291.0)

## 2017-12-24 MED ORDER — AMLODIPINE BESYLATE 10 MG PO TABS: 10.0000 mg | ORAL_TABLET | Freq: Every day | ORAL | 3 refills | Status: DC

## 2017-12-24 MED ORDER — LISINOPRIL 20 MG PO TABS: 20.0000 mg | ORAL_TABLET | Freq: Every day | ORAL | 3 refills | Status: DC

## 2017-12-24 MED ORDER — LINACLOTIDE 145 MCG PO CAPS: 145.0000 ug | ORAL_CAPSULE | Freq: Every day | ORAL | 3 refills | Status: DC

## 2017-12-24 NOTE — Progress Notes (Signed)
Subjective:  Patient ID: Jane DonningJacqueline Karbowski, female    DOB: 09/29/1970  Age: 47 y.o. MRN: [id number]  CC: Follow-up (HTN, hyperlipidemia/refill linzess. )  Anemia  Presents for follow-up visit. There has been no abdominal pain, anorexia, bruising/bleeding easily, light-headedness, malaise/fatigue, pallor, palpitations, paresthesias or pica. Signs of blood loss that are present include menorrhagia and vaginal bleeding. Signs of blood loss that are not present include hematemesis, hematochezia and melena. There are no compliance problems.  Compliance with medications is 76-100%.  Hypertension  Pertinent negatives include no anxiety, blurred vision, chest pain, headaches, malaise/fatigue, orthopnea, palpitations, peripheral edema, PND or shortness of breath. There are no associated agents to hypertension. Risk factors for coronary artery disease include family history and obesity. Past treatments include ACE inhibitors and calcium channel blockers. There are no compliance problems.    Reviewed past Medical, Social and Family history today.  Outpatient Medications Prior to Visit  Medication Sig Dispense Refill  . ibuprofen (ADVIL,MOTRIN) 800 MG tablet   0  . saccharomyces boulardii (FLORASTOR) 250 MG capsule Take 1 capsule (250 mg total) by mouth 2 (two) times daily.    Marland Kitchen. amLODipine (NORVASC) 10 MG tablet Take 1 tablet (10 mg total) by mouth at bedtime. 90 tablet 0  . linaclotide (LINZESS) 145 MCG CAPS capsule Take 1 capsule (145 mcg total) by mouth daily before breakfast. Need office visit for additional refills 90 capsule 0  . lisinopril (PRINIVIL,ZESTRIL) 20 MG tablet Take 1 tablet (20 mg total) daily by mouth. 90 tablet 1  . amoxicillin (AMOXIL) 500 MG capsule   0  . lisinopril (PRINIVIL,ZESTRIL) 20 MG tablet Take 1 tablet (20 mg total) by mouth daily. Need appointment for further refills (Patient not taking: Reported on 12/24/2017) 90 tablet 0   No facility-administered medications prior to  visit.    ROS Review of Systems  Constitutional: Negative.  Negative for malaise/fatigue.  Eyes: Negative for blurred vision.  Respiratory: Negative for cough and shortness of breath.   Cardiovascular: Negative for chest pain, palpitations, orthopnea, leg swelling and PND.  Gastrointestinal: Positive for constipation. Negative for abdominal pain, anorexia, hematemesis, hematochezia and melena.  Genitourinary: Positive for menorrhagia and vaginal bleeding.  Skin: Negative for pallor.  Neurological: Negative for light-headedness, headaches and paresthesias.  Endo/Heme/Allergies: Does not bruise/bleed easily.  Psychiatric/Behavioral: Negative.    Objective:  BP 136/84   Pulse (!) 57   Temp 98.4 F (36.9 C) (Oral)   Ht 5\' 2"  (1.575 m)   Wt 183 lb (83 kg)   SpO2 99%   BMI 33.47 kg/m   BP Readings from Last 3 Encounters:  12/24/17 136/84  09/07/17 (!) 157/93  02/02/17 130/88    Wt Readings from Last 3 Encounters:  12/24/17 183 lb (83 kg)  02/02/17 193 lb (87.5 kg)  01/05/17 195 lb (88.5 kg)    Physical Exam  Constitutional: She is oriented to person, place, and time. She appears well-developed and well-nourished.  Eyes: Conjunctivae are normal. No scleral icterus.  Neck: No JVD present.  Cardiovascular: Normal rate, regular rhythm and intact distal pulses.  Murmur heard. Pulmonary/Chest: Effort normal and breath sounds normal.  Abdominal: Soft. Bowel sounds are normal. There is no tenderness.  Musculoskeletal: She exhibits no edema.  Neurological: She is alert and oriented to person, place, and time.  Skin: Skin is warm and dry.  Psychiatric: She has a normal mood and affect. Her behavior is normal. Thought content normal.  Vitals reviewed.   Lab Results  Component Value Date  WBC 6.8 12/24/2017   HGB 13.4 12/24/2017   HCT 40.1 12/24/2017   PLT 313.0 12/24/2017   GLUCOSE 78 12/24/2017   CHOL 165 01/05/2017   TRIG 110.0 01/05/2017   HDL 62.60 01/05/2017    LDLCALC 80 01/05/2017   ALT 18 01/05/2017   AST 24 01/05/2017   NA 138 12/24/2017   K 3.5 12/24/2017   CL 105 12/24/2017   CREATININE 0.83 12/24/2017   BUN 16 12/24/2017   CO2 26 12/24/2017   TSH 0.39 12/24/2017   HGBA1C 5.7 01/05/2017   MICROALBUR 4.40 (H) 08/24/2008    Assessment & Plan:   Amadea was seen today for follow-up.  Diagnoses and all orders for this visit:  Essential hypertension -     CBC w/Diff -     Basic metabolic panel -     TSH -     Discontinue: lisinopril (PRINIVIL,ZESTRIL) 20 MG tablet; Take 1 tablet (20 mg total) by mouth daily. -     Discontinue: amLODipine (NORVASC) 10 MG tablet; Take 1 tablet (10 mg total) by mouth at bedtime. -     lisinopril (PRINIVIL,ZESTRIL) 20 MG tablet; Take 1 tablet (20 mg total) by mouth daily. -     amLODipine (NORVASC) 10 MG tablet; Take 1 tablet (10 mg total) by mouth at bedtime.  Hyperglycemia -     Basic metabolic panel  Chronic idiopathic constipation -     linaclotide (LINZESS) 145 MCG CAPS capsule; Take 1 capsule (145 mcg total) by mouth daily before breakfast.  Iron deficiency anemia due to chronic blood loss -     CBC w/Diff -     IBC panel -     Ferritin  Heart murmur on physical examination   I have changed Nazaret Cater's linaclotide. I am also having her maintain her amoxicillin, ibuprofen, saccharomyces boulardii, lisinopril, and amLODipine.  Meds ordered this encounter  Medications  . linaclotide (LINZESS) 145 MCG CAPS capsule    Sig: Take 1 capsule (145 mcg total) by mouth daily before breakfast.    Dispense:  90 capsule    Refill:  3  . DISCONTD: lisinopril (PRINIVIL,ZESTRIL) 20 MG tablet    Sig: Take 1 tablet (20 mg total) by mouth daily.    Dispense:  90 tablet    Refill:  3    Order Specific Question:   Supervising Provider    Answer:   MATTHEWS, CODY [[id number]]  . DISCONTD: amLODipine (NORVASC) 10 MG tablet    Sig: Take 1 tablet (10 mg total) by mouth at bedtime.    Dispense:  90  tablet    Refill:  3    Order Specific Question:   Supervising Provider    Answer:   MATTHEWS, CODY [[id number]]  . lisinopril (PRINIVIL,ZESTRIL) 20 MG tablet    Sig: Take 1 tablet (20 mg total) by mouth daily.    Dispense:  90 tablet    Refill:  3    Order Specific Question:   Supervising Provider    Answer:   MATTHEWS, CODY [[id number]]  . amLODipine (NORVASC) 10 MG tablet    Sig: Take 1 tablet (10 mg total) by mouth at bedtime.    Dispense:  90 tablet    Refill:  3    Order Specific Question:   Supervising Provider    Answer:   Ashley Royalty, CODY [[id number]]    Follow-up: Return in about 6 months (around 06/25/2018) for HTN and heart mumur.  Alysia Penna, NP

## 2017-12-24 NOTE — Progress Notes (Signed)
Pharmacy verify received of rx.

## 2017-12-24 NOTE — Progress Notes (Signed)
Pharmacy verify received rx.

## 2017-12-24 NOTE — Patient Instructions (Addendum)
Continue the good job with weight loss and exercise.  Normal iron panel and cbc. Continue ferrous sulfate supplement. Normal thyroid function and BMP. Continue lisinopril, amlodipine and linzess as prescribed.   Heart Murmur A heart murmur is an extra sound that is caused by chaotic blood flow. The murmur can be heard as a "hum" or "whoosh" sound when blood flows through the heart. The heart has four areas called chambers. Valves separate the upper and lower chambers from each other (tricuspid valve and mitral valve) and separate the lower chambers of the heart from pathways that lead away from the heart (aortic valve and pulmonary valve). Normally, the valves open to let blood flow through or out of your heart, and then they shut to keep the blood from flowing backward. There are two types of heart murmurs:  Innocent murmurs. Most people with this type of heart murmur do not have a heart problem. Many children have innocent heart murmurs. Your health care provider may suggest some basic testing to find out whether your murmur is an innocent murmur. If an innocent heart murmur is found, there is no need for further tests or treatment and no need to restrict activities or stop playing sports.  Abnormal murmurs. These types of murmurs can occur in children and adults. Abnormal murmurs may be a sign of a more serious heart condition, such as a heart defect present at birth (congenital defect) or heart valve disease.  What are the causes? This condition is caused by heart valves that are not working properly. In children, abnormal heart murmurs are typically caused by congenital defects. In adults, abnormal murmurs are usually from heart valve problems caused by disease, infection, or aging. Three types of heart valve defects can cause a murmur:  Regurgitation. This is when blood leaks back through the valve in the wrong direction.  Mitral valve prolapse. This is when the mitral valve of the heart  has a loose flap and does not close tightly.  Stenosis. This is when a valve does not open enough and blocks blood flow.  This condition may also be caused by:  Pregnancy.  Fever.  Overactive thyroid gland.  Anemia.  Exercise.  Rapid growth spurts (in children).  What are the signs or symptoms? Innocent murmurs do not cause symptoms, and many people with abnormal murmurs may or may not have symptoms. If symptoms do develop, they may include:  Shortness of breath.  Blue coloring of the skin, especially on the fingertips.  Chest pain.  Palpitations, or feeling a fluttering or skipped heartbeat.  Fainting.  Persistent cough.  Getting tired much faster than expected.  Swelling in the abdomen, feet, or ankles.  How is this diagnosed? This condition may be diagnosed during a routine physical or other exam. If your health care provider hears a murmur with a stethoscope, he or she will listen for:  Where the murmur is located in your heart.  How long the murmur lasts (duration).  When the murmur is heard during the heartbeat.  How loud the murmur is. This may help the health care provider figure out what is causing the murmur.  You may be referred to a heart specialist (cardiologist). You may also have other tests, including:  Electrocardiogram (ECG or EKG). This test measures the electrical activity of your heart.  Echocardiogram. This test uses high frequency sound waves to make pictures of your heart.  MRI or chest X-ray.  Cardiac catheterization. This test looks at blood flow through the  heart.  For children and adults who have an abnormal heart murmur and want to stay active, it is important to complete testing, review test results, and receive recommendations from your health care provider. If heart disease is present, it may not be safe to play or be active. How is this treated? Heart murmurs themselves do not need treatment. In some cases, a heart murmur  may go away on its own. If an underlying problem or disease is causing the murmur, you may need treatment. If treatment is needed, it will depend on the type and severity of the disease or heart problem causing the murmur. Treatment may include:  Medicine.  Surgery.  Dietary and lifestyle changes.  Follow these instructions at home:  Talk with your health care provider before participating in sports or other activities that require a lot of effort and energy (are strenuous).  Learn as much as possible about your condition and any related diseases. Ask your health care provider if you may at risk for any medical emergencies.  Talk with your health care provider about what symptoms you should look out for.  It is up to you to get your test results. Ask your health care provider, or the department that is doing the test, when your results will be ready.  Keep all follow-up visits as told by your health care provider. This is important. Contact a health care provider if:  You feel light-headed.  You are frequently short of breath.  You feel more tired than usual.  You are having a hard time keeping up with normal activities or fitness routines.  You have swelling in your ankles or feet.  You have chest pain.  You notice that your heart often beats irregularly.  You develop any new symptoms. Get help right away if:  You develop severe chest pain.  You are having trouble breathing.  You have fainting spells.  Your symptoms suddenly get worse. These symptoms may represent a serious problem that is an emergency. Do not wait to see if the symptoms will go away. Get medical help right away. Call your local emergency services (911 in the [place] Do not drive yourself to the hospital. Summary  Normally, the heart valves open to let blood flow through or out of your heart, and then they shut to keep the blood from flowing backward.  Heart murmur is caused by heart valves that are  not working properly.  You may need treatment if an underlying problem or disease is causing the heart murmur. Treatment may include medicine, surgery, or dietary and lifestyle changes.  Talk with your health care provider before participating in sports or other activities that require a lot of effort and energy (are strenuous).  Talk with your health care provider about what symptoms you should watch out for. This information is not intended to replace advice given to you by your health care provider. Make sure you discuss any questions you have with your health care provider. Document Released: 05/21/2004 Document Revised: 04/01/2016 Document Reviewed: 04/01/2016 Elsevier Interactive Patient Education  [employer].

## 2017-12-27 ENCOUNTER — Encounter: Payer: Self-pay | Admitting: Nurse Practitioner

## 2018-02-21 ENCOUNTER — Telehealth: Payer: Self-pay

## 2018-02-21 NOTE — Telephone Encounter (Signed)
Ok to send trulance 3mg  1tab daily, #30 with 5refills

## 2018-02-21 NOTE — Telephone Encounter (Signed)
Pt will call back in 04/2018 for [place] to send in this rx. Waiting for the pt to call back.

## 2018-02-21 NOTE — Telephone Encounter (Signed)
Pt stated that alternative is Trulance. Please advise, oct med didn't help, she tried in the past.

## 2018-02-21 NOTE — Telephone Encounter (Signed)
Copied from CRM [id number]. Topic: General - Other >> Feb 21, 2018  2:03 PM Jaquita Rector A wrote: Reason for CRM: Patient called to say that she received a letter from her insurance company stating that as of 04/27/2018 they will no longer cover Linzess and want to know what to do. Please advise   Ph# [phone]

## 2018-05-09 MED ORDER — PLECANATIDE 3 MG PO TABS: 3.0000 mg | ORAL_TABLET | Freq: Every day | ORAL | 1 refills | Status: DC

## 2018-05-09 NOTE — Addendum Note (Signed)
Addended by: Francine Graven on: 05/09/2018 03:16 PM   Modules accepted: Orders

## 2018-05-09 NOTE — Telephone Encounter (Signed)
Pt needs refill request for the Trulance 3mg  tablets #30 with 5 refills as noted by Dr. Elease EtienneNche.  Please send request to   [id number] [address] [phone] (Phone) [phone] (Fax)

## 2018-05-13 NOTE — Telephone Encounter (Signed)
Pt would like this to be a 90 day rx due to price.  Plecanatide (TRULANCE) 3 MG TABS  [id number] [address] [phone] (Phone) [phone] (Fax)   Pt picked up a 30 day, but requesting call to pharmacy and change Rx to 90 day with refills.

## 2018-05-17 MED ORDER — PLECANATIDE 3 MG PO TABS: 3.0000 mg | ORAL_TABLET | Freq: Every day | ORAL | 0 refills | Status: DC

## 2018-05-17 NOTE — Addendum Note (Signed)
Addended byLivingston Diones on: 05/17/2018 11:33 AM   Modules accepted: Orders

## 2018-10-05 ENCOUNTER — Other Ambulatory Visit: Payer: Self-pay | Admitting: Nurse Practitioner

## 2018-10-05 DIAGNOSIS — Z1231 Encounter for screening mammogram for malignant neoplasm of breast: Secondary | ICD-10-CM

## 2018-10-12 ENCOUNTER — Ambulatory Visit
Admission: RE | Admit: 2018-10-12 | Discharge: 2018-10-12 | Disposition: A | Discharge status: Home / Self Care | Payer: BC Managed Care – PPO | Source: Ambulatory Visit | Attending: Nurse Practitioner | Admitting: Nurse Practitioner

## 2018-10-12 ENCOUNTER — Other Ambulatory Visit: Payer: Self-pay

## 2018-10-12 DIAGNOSIS — Z1231 Encounter for screening mammogram for malignant neoplasm of breast: Secondary | ICD-10-CM | POA: Diagnosis not present

## 2018-10-14 ENCOUNTER — Other Ambulatory Visit: Payer: Self-pay | Admitting: Nurse Practitioner

## 2018-10-14 DIAGNOSIS — R928 Other abnormal and inconclusive findings on diagnostic imaging of breast: Secondary | ICD-10-CM

## 2018-10-18 ENCOUNTER — Ambulatory Visit
Admission: RE | Admit: 2018-10-18 | Discharge: 2018-10-18 | Disposition: A | Discharge status: Home / Self Care | Payer: BC Managed Care – PPO | Source: Ambulatory Visit | Attending: Nurse Practitioner | Admitting: Nurse Practitioner

## 2018-10-18 ENCOUNTER — Other Ambulatory Visit: Payer: Self-pay

## 2018-10-18 DIAGNOSIS — R928 Other abnormal and inconclusive findings on diagnostic imaging of breast: Secondary | ICD-10-CM

## 2018-10-18 DIAGNOSIS — N6012 Diffuse cystic mastopathy of left breast: Secondary | ICD-10-CM | POA: Diagnosis not present

## 2018-10-20 ENCOUNTER — Ambulatory Visit (INDEPENDENT_AMBULATORY_CARE_PROVIDER_SITE_OTHER): Payer: BC Managed Care – PPO | Admitting: Nurse Practitioner

## 2018-10-20 ENCOUNTER — Other Ambulatory Visit: Payer: Self-pay

## 2018-10-20 ENCOUNTER — Encounter (HOSPITAL_COMMUNITY): Payer: Self-pay

## 2018-10-20 ENCOUNTER — Encounter: Payer: Self-pay | Admitting: Nurse Practitioner

## 2018-10-20 ENCOUNTER — Ambulatory Visit (HOSPITAL_COMMUNITY)
Admission: EM | Admit: 2018-10-20 | Discharge: 2018-10-20 | Disposition: A | Discharge status: Home / Self Care | Payer: BC Managed Care – PPO | Attending: Emergency Medicine | Admitting: Emergency Medicine

## 2018-10-20 VITALS — Ht 62.0 in

## 2018-10-20 DIAGNOSIS — T783XXA Angioneurotic edema, initial encounter: Secondary | ICD-10-CM | POA: Diagnosis not present

## 2018-10-20 DIAGNOSIS — I10 Essential (primary) hypertension: Secondary | ICD-10-CM

## 2018-10-20 DIAGNOSIS — T464X5A Adverse effect of angiotensin-converting-enzyme inhibitors, initial encounter: Secondary | ICD-10-CM

## 2018-10-20 MED ORDER — PREDNISONE 10 MG (21) PO TBPK: ORAL_TABLET | ORAL | 0 refills | Status: DC

## 2018-10-20 NOTE — ED Triage Notes (Signed)
Patient presents to Urgent Care with complaints of upper lip swelling since last night. Patient reports she is on lisinopril, airway intact.

## 2018-10-20 NOTE — ED Provider Notes (Signed)
[facility]    CSN: [id number] Arrival date & time: 10/20/18  0803     History   Chief Complaint Chief Complaint  Patient presents with  . Oral Swelling    HPI Tempie DonningJacqueline Crompton is a 48 y.o. female with history of hypertension presenting for upper lip swelling.  Patient states that she has been on lisinopril "for a long time "for her blood pressure, though noticed upper lip swelling yesterday afternoon.  Patient states that she skipped her lisinopril dose this morning, though noted increased lip swelling when she woke up.  Patient denies fever, arthralgias, myalgias, tongue/throat swelling, wheezing, difficulty breathing, cough, rash, abdominal pain.  Patient states she took Benadryl last night which helped symptoms a little bit.   Past Medical History:  Diagnosis Date  . Hypertension   . Migraines     Patient Active Problem List   Diagnosis Date Noted  . Heart murmur on physical examination 12/24/2017  . Iron deficiency anemia due to chronic blood loss 12/24/2017  . Chronic idiopathic constipation 12/24/2017  . Family history of aneurysm of blood vessel of brain 01/05/2017  . Encounter for Papanicolaou smear for cervical cancer screening 01/05/2017  . Hyperglycemia 01/05/2017  . Hematochezia 09/06/2015  . PMDD (premenstrual dysphoric disorder) 09/06/2015  . Menstrual migraine 09/06/2015  . Hemorrhoid 11/06/2014  . Essential hypertension 04/25/2014    Past Surgical History:  Procedure Laterality Date  . CESAREAN SECTION    . TONSILLECTOMY    . TUBAL LIGATION      OB History   No obstetric history on file.      Home Medications    Prior to Admission medications   Medication Sig Start Date End Date Taking? Authorizing Provider  amLODipine (NORVASC) 10 MG tablet Take 1 tablet (10 mg total) by mouth at bedtime. 12/24/17  Yes Nche, Bonna Gainsharlotte Lum, NP  ibuprofen (ADVIL,MOTRIN) 800 MG tablet  01/04/17   [provider]  linaclotide (LINZESS) 145  MCG CAPS capsule Take 1 capsule (145 mcg total) by mouth daily before breakfast. 12/24/17   Nche, Bonna Gainsharlotte Lum, NP  predniSONE (STERAPRED UNI-PAK 21 TAB) 10 MG (21) TBPK tablet Take as written 10/20/18   Hall-Potvin, [place], PA-C  lisinopril (PRINIVIL,ZESTRIL) 20 MG tablet Take 1 tablet (20 mg total) by mouth daily. 12/24/17 10/20/18  Nche, Bonna Gainsharlotte Lum, NP    Family History Family History  Problem Relation Age of Onset  . Hypertension Mother   . Heart disease Mother   . Diabetes Mother   . Hypertension Father   . Anuerysm Father        brain  . Diabetes Maternal Grandmother   . Anuerysm Sister        brain    Social History Social History   Tobacco Use  . Smoking status: Former Smoker    Years: 15.00    Types: Cigarettes    Quit date: 10/02/2000    Years since quitting: 18.0  . Smokeless tobacco: Never Used  Substance Use Topics  . Alcohol use: No    Alcohol/week: 0.0 standard drinks  . Drug use: No     Allergies   Lisinopril   Review of Systems Review of Systems  Constitutional: Negative for fatigue and fever.  HENT: Negative for drooling, tinnitus, trouble swallowing and voice change.   Respiratory: Negative for cough, choking, chest tightness, shortness of breath and stridor.   Cardiovascular: Negative for chest pain and palpitations.  Gastrointestinal: Negative for abdominal pain and vomiting.  Musculoskeletal: Negative for  arthralgias and myalgias.     Physical Exam Triage Vital Signs ED Triage Vitals  Enc Vitals Group     BP 10/20/18 0819 (!) 155/93     Pulse Rate 10/20/18 0819 69     Resp 10/20/18 0819 17     Temp 10/20/18 0819 98.7 F (37.1 C)     Temp Source 10/20/18 0819 Oral     SpO2 --      Weight --      Height --      Head Circumference --      Peak Flow --      Pain Score 10/20/18 0816 0     Pain Loc --      Pain Edu? --      Excl. in GC? --    No data found.  Updated Vital Signs BP (!) 155/93 (BP Location: Right Arm)   Pulse 69    Temp 98.7 F (37.1 C) (Oral)   Resp 17   Visual Acuity Right Eye Distance:   Left Eye Distance:   Bilateral Distance:    Right Eye Near:   Left Eye Near:    Bilateral Near:     Physical Exam Vitals signs and nursing note reviewed.  Constitutional:      General: She is not in acute distress. HENT:     Head: Normocephalic and atraumatic.     Mouth/Throat:     Mouth: Mucous membranes are moist.     Pharynx: Oropharynx is clear. No oropharyngeal exudate or posterior oropharyngeal erythema.     Comments: Upper lip swollen diffusely w/o erythema, openings, discharge.  Uvula is midline, rises symmetrically, nonedematous. Eyes:     General: No scleral icterus.    Conjunctiva/sclera: Conjunctivae normal.     Pupils: Pupils are equal, round, and reactive to light.  Cardiovascular:     Rate and Rhythm: Normal rate.  Pulmonary:     Effort: Pulmonary effort is normal. No respiratory distress.  Skin:    General: Skin is warm.     Capillary Refill: Capillary refill takes less than 2 seconds.     Coloration: Skin is not jaundiced or pale.  Neurological:     General: No focal deficit present.     Mental Status: She is alert and oriented to person, place, and time.      UC Treatments / Results  Labs (all labs ordered are listed, but only abnormal results are displayed) Labs Reviewed - No data to display  EKG None  Radiology  Procedures Procedures (including critical care time)  Medications Ordered in UC Medications - No data to display  Initial Impression / Assessment and Plan / UC Course  I have reviewed the triage vital signs and the nursing notes.  Pertinent labs & imaging results that were available during my care of the patient were reviewed by me and considered in my medical decision making (see chart for details).     48 year old female with history of hypertension previously on lisinopril presenting for upper lip swelling.  Patient self discontinued  lisinopril, is stable/not in acute distress in office today, and intends on following up with PCP regarding further hypertensive management.  Patient given steroid taper pack, instructed to take Benadryl at bedtime.  Return precautions discussed, patient verbalized understanding. Final Clinical Impressions(s) / UC Diagnoses   Final diagnoses:  Angioedema due to angiotensin converting enzyme inhibitor (ACE-I)     Discharge Instructions     Take steroid pack as written. May take  benadryl at bedtime. Call PCP to update allergy list & discuss new BP medication.    ED Prescriptions    Medication Sig Dispense Auth. Provider   predniSONE (STERAPRED UNI-PAK 21 TAB) 10 MG (21) TBPK tablet Take as written 21 tablet Hall-Potvin, [place], PA-C     Controlled Substance Prescriptions [place] Controlled Substance Registry consulted? Not Applicable   Quincy Sheehan, [place] 10/20/18 4098

## 2018-10-20 NOTE — Progress Notes (Signed)
Virtual Visit via Video Note  I connected with Jane Hampton on 10/20/18 at  1:00 PM EDT by a video enabled telemedicine application and verified that I am speaking with the correct person using two identifiers.  Location: Patient: Home Provider: office   I discussed the limitations of evaluation and management by telemedicine and the availability of in person appointments. The patient expressed understanding and agreed to proceed.  CC: pt went to the ED this morning due to lips swelling (started last night--took banedryl)--pt stated Ed told her it was from lisinipril--which she has been on it for a long time. she is on steroid for 6 days.   History of Present Illness: Allergic Reaction This is a new problem. The current episode started yesterday. The problem occurs constantly. The problem is unchanged. The patient was exposed to a prescription drug. The time of exposure was just prior to onset. The exposure occurred at home. Pertinent negatives include no chest pain, chest pressure, coughing, difficulty breathing, drooling, globus sensation, itching, rash, stridor, trouble swallowing or wheezing. Swelling is present on the lips. Past treatments include oral corticosteroid and diphenhydramine. The treatment provided mild relief. There is no history of asthma, atopic dermatitis, food allergies, medication allergies or seasonal allergies.  evaluated in person by provider in ED this morning.  HTN: Lisinopril discontinued, last dose last night. Also took amlodipine. Elevated during ED visit. Has not checked since BP Readings from Last 3 Encounters:  10/20/18 (!) 155/93  12/24/17 136/84  09/07/17 (!) 157/93   Observations/Objective: Physical Exam  Constitutional: She is oriented to person, place, and time.  HENT:  Mouth/Throat: Uvula is midline and oropharynx is clear and moist. No trismus in the jaw. No posterior oropharyngeal erythema.  Swollen upper lip. Normal voice and speech.   Pulmonary/Chest: Effort normal.  Neurological: She is alert and oriented to person, place, and time.  Psychiatric: She has a normal mood and affect. Her behavior is normal. Thought content normal.  Vitals reviewed.  Assessment and Plan: Jane Hampton was seen today for allergic reaction.  Diagnoses and all orders for this visit:  Angioedema, initial encounter  Essential hypertension   Follow Up Instructions: Return to work tomorrow night. Discontinue lisinopril Take benadryl 25mg  OTC now and at bedtime today. Complete oral prednisone as prescribed. Call office with BP tomorrow. Call 911 if symptoms worsen   I discussed the assessment and treatment plan with the patient. The patient was provided an opportunity to ask questions and all were answered. The patient agreed with the plan and demonstrated an understanding of the instructions.   The patient was advised to call back or seek an in-person evaluation if the symptoms worsen or if the condition fails to improve as anticipated.  Wilfred Lacy, NP

## 2018-10-20 NOTE — Patient Instructions (Addendum)
Return to work tomorrow night. Discontinue lisinopril Take benadryl 25mg  OTC now and at bedtime today. Complete oral prednisone as prescribed. Call office with BP tomorrow. Call 911 if symptoms worsen.   Angioedema Angioedema is sudden swelling in the body. The swelling can happen in any part of the body. It often happens on the skin and causes itchy, bumpy patches (hives) to form. This condition may:  Happen only one time.  Happen more than one time. It may come back at random times.  Keep coming back for a number of years. Someday it may stop coming back. Follow these instructions at home:  Take over-the-counter and prescription medicines only as told by your doctor.  If you were given medicines for emergency allergy treatment, always carry them with you.  Wear a medical bracelet as told by your doctor.  Avoid the things that cause your attacks (triggers).  If this condition was passed to you from your parents and you want to have kids, talk to your doctor. Your kids may also have this condition. Contact a doctor if:  You have another attack.  Your attacks happen more often, even after you take steps to prevent them.  This condition was passed to you by your parents and you want to have kids. Get help right away if:  Your mouth, tongue, or lips get very swollen.  You have trouble breathing.  You have trouble swallowing.  You pass out (faint). This information is not intended to replace advice given to you by your health care provider. Make sure you discuss any questions you have with your health care provider. Document Released: 04/01/2009 Document Revised: 11/13/2015 Document Reviewed: 10/22/2015 Elsevier Interactive Patient Education  2019 [employer].

## 2018-10-20 NOTE — Discharge Instructions (Addendum)
Take steroid pack as written. May take benadryl at bedtime. Call PCP to update allergy list & discuss new BP medication.

## 2018-11-09 ENCOUNTER — Telehealth: Payer: Self-pay | Admitting: Nurse Practitioner

## 2018-11-09 DIAGNOSIS — I10 Essential (primary) hypertension: Secondary | ICD-10-CM

## 2018-11-09 MED ORDER — VALSARTAN 160 MG PO TABS: 160.0000 mg | ORAL_TABLET | Freq: Every day | ORAL | 1 refills | Status: DC

## 2018-11-09 NOTE — Telephone Encounter (Signed)
Start valsartan 160mg  and continue amlodipne. Schedule 70month f/up (F2F) for HTN

## 2018-11-09 NOTE — Telephone Encounter (Signed)
Copied from [place] [id number]. Topic: General - Other >> Nov 09, 2018  8:12 AM Leward Quan A wrote: Reason for CRM: Patient called to report her BP to C. Nche this am BP 155/80,  11/08/2018 150/79,  11/07/2018 150/79  Patient can be reached at Ph# [phone]

## 2018-11-25 ENCOUNTER — Other Ambulatory Visit: Payer: Self-pay | Admitting: Nurse Practitioner

## 2018-11-25 DIAGNOSIS — K5904 Chronic idiopathic constipation: Secondary | ICD-10-CM

## 2018-11-25 NOTE — Telephone Encounter (Signed)
Jane Hampton please advise pt last refill 12/24/2017 #90 3 refills. Last OV for this was also 12/24/2017. No upcoming appt set.

## 2018-11-28 ENCOUNTER — Telehealth: Payer: Self-pay

## 2018-11-28 DIAGNOSIS — K5904 Chronic idiopathic constipation: Secondary | ICD-10-CM

## 2018-11-28 NOTE — Telephone Encounter (Signed)
[employer] does not cover Linzess but they will cover Truliance/plz advise/thx dmf

## 2018-11-29 MED ORDER — TRULANCE 3 MG PO TABS: 1.0000 | ORAL_TABLET | Freq: Every day | ORAL | 0 refills | Status: DC

## 2018-11-29 NOTE — Telephone Encounter (Signed)
Inform [name] she is overdue on Annual wellness exam and HTN F/up. She needs to schedule F2F appt before she is out of medications

## 2018-12-01 ENCOUNTER — Telehealth: Payer: Self-pay | Admitting: Nurse Practitioner

## 2018-12-01 DIAGNOSIS — K5904 Chronic idiopathic constipation: Secondary | ICD-10-CM

## 2018-12-01 MED ORDER — LINACLOTIDE 145 MCG PO CAPS: 145.0000 ug | ORAL_CAPSULE | Freq: Every day | ORAL | 0 refills | Status: DC

## 2018-12-01 NOTE — Telephone Encounter (Signed)
Spoke with the pt, request permission to change rx of Trulance back to Linzess-- pt stated her new [employer] of [place] will pay for it now. Pt aware to make an appt for more refill.

## 2018-12-01 NOTE — Telephone Encounter (Signed)
Copied from [place] [id number]. Topic: Quick Communication - See Telephone Encounter >> Dec 01, 2018  8:37 AM Nils Flack wrote: CRM for notification. See Telephone encounter for: 12/01/18. PA is needed for med Plecanatide (TRULANCE) 3 MG TABS For medicaid.  Pt states she no longer has bcbs

## 2018-12-01 NOTE — Telephone Encounter (Signed)
Call pharmacy and cancel Truluance prescription. Send linzess, 90tabs, no refills Inform Ms.Dado she is overdue on Annual wellness exam and HTN F/up. She needs to schedule F2F appt before she is out of medications

## 2018-12-01 NOTE — Telephone Encounter (Signed)
Pt aware, Trulance cancel with pharmacy.

## 2018-12-01 NOTE — Telephone Encounter (Signed)
Copied from [place] [id number]. Topic: General - Other >> Dec 01, 2018  9:32 AM Keene Breath wrote: Reason for CRM: Patient called to request that the nurse call her to get a PA for the linzess medication.  Please advise and call patient once this has been approved.  CB# [phone]

## 2019-02-06 ENCOUNTER — Other Ambulatory Visit: Payer: Self-pay

## 2019-02-06 DIAGNOSIS — Z20822 Contact with and (suspected) exposure to covid-19: Secondary | ICD-10-CM

## 2019-02-07 LAB — NOVEL CORONAVIRUS, NAA: SARS-CoV-2, NAA: NOT DETECTED

## 2019-02-27 ENCOUNTER — Telehealth: Payer: Self-pay | Admitting: Nurse Practitioner

## 2019-02-27 NOTE — Telephone Encounter (Signed)

## 2019-02-28 ENCOUNTER — Other Ambulatory Visit: Payer: Self-pay

## 2019-02-28 ENCOUNTER — Ambulatory Visit (INDEPENDENT_AMBULATORY_CARE_PROVIDER_SITE_OTHER): Payer: Managed Care, Other (non HMO) | Admitting: Nurse Practitioner

## 2019-02-28 ENCOUNTER — Encounter: Payer: Self-pay | Admitting: Nurse Practitioner

## 2019-02-28 VITALS — BP 168/92 | HR 82 | Temp 96.5°F | Ht 62.0 in | Wt 194.2 lb

## 2019-02-28 DIAGNOSIS — R739 Hyperglycemia, unspecified: Secondary | ICD-10-CM

## 2019-02-28 DIAGNOSIS — I10 Essential (primary) hypertension: Secondary | ICD-10-CM

## 2019-02-28 DIAGNOSIS — D500 Iron deficiency anemia secondary to blood loss (chronic): Secondary | ICD-10-CM | POA: Diagnosis not present

## 2019-02-28 DIAGNOSIS — F3281 Premenstrual dysphoric disorder: Secondary | ICD-10-CM

## 2019-02-28 DIAGNOSIS — Z13220 Encounter for screening for lipoid disorders: Secondary | ICD-10-CM | POA: Diagnosis not present

## 2019-02-28 DIAGNOSIS — Z136 Encounter for screening for cardiovascular disorders: Secondary | ICD-10-CM

## 2019-02-28 DIAGNOSIS — Z23 Encounter for immunization: Secondary | ICD-10-CM | POA: Diagnosis not present

## 2019-02-28 DIAGNOSIS — K5904 Chronic idiopathic constipation: Secondary | ICD-10-CM

## 2019-02-28 LAB — LIPID PANEL
Cholesterol: 173 mg/dL (ref 0–200)
HDL: 62.1 mg/dL (ref >39.00)
LDL Cholesterol: 78 mg/dL (ref 0–99)
NonHDL: 111.03
Total CHOL/HDL Ratio: 3
Triglycerides: 166 mg/dL — ABNORMAL HIGH (ref 0.0–149.0)
VLDL: 33.2 mg/dL (ref 0.0–40.0)

## 2019-02-28 LAB — BASIC METABOLIC PANEL
BUN: 9 mg/dL (ref 6–23)
CO2: 28 mEq/L (ref 19–32)
Calcium: 9.5 mg/dL (ref 8.4–10.5)
Chloride: 104 mEq/L (ref 96–112)
Creatinine, Ser: 0.68 mg/dL (ref 0.40–1.20)
GFR: 112.2 mL/min (ref >60.00)
Glucose, Bld: 109 mg/dL — ABNORMAL HIGH (ref 70–99)
Potassium: 3.5 mEq/L (ref 3.5–5.1)
Sodium: 138 mEq/L (ref 135–145)

## 2019-02-28 LAB — CBC WITH DIFFERENTIAL/PLATELET
Basophils Absolute: 0.1 10*3/uL (ref 0.0–0.1)
Basophils Relative: 2.1 % (ref 0.0–3.0)
Eosinophils Absolute: 0.2 10*3/uL (ref 0.0–0.7)
Eosinophils Relative: 3.6 % (ref 0.0–5.0)
HCT: 34.1 % — ABNORMAL LOW (ref 36.0–46.0)
Hemoglobin: 11.2 g/dL — ABNORMAL LOW (ref 12.0–15.0)
Lymphocytes Relative: 30 % (ref 12.0–46.0)
Lymphs Abs: 1.7 10*3/uL (ref 0.7–4.0)
MCHC: 32.9 g/dL (ref 30.0–36.0)
MCV: 81.3 fl (ref 78.0–100.0)
Monocytes Absolute: 0.6 10*3/uL (ref 0.1–1.0)
Monocytes Relative: 9.7 % (ref 3.0–12.0)
Neutro Abs: 3.1 10*3/uL (ref 1.4–7.7)
Neutrophils Relative %: 54.6 % (ref 43.0–77.0)
Platelets: 312 10*3/uL (ref 150.0–400.0)
RBC: 4.2 Mil/uL (ref 3.87–5.11)
RDW: 16.7 % — ABNORMAL HIGH (ref 11.5–15.5)
WBC: 5.7 10*3/uL (ref 4.0–10.5)

## 2019-02-28 LAB — HEPATIC FUNCTION PANEL
ALT: 31 U/L (ref 0–35)
AST: 29 U/L (ref 0–37)
Albumin: 4.1 g/dL (ref 3.5–5.2)
Alkaline Phosphatase: 60 U/L (ref 39–117)
Bilirubin, Direct: 0.1 mg/dL (ref 0.0–0.3)
Total Bilirubin: 0.2 mg/dL (ref 0.2–1.2)
Total Protein: 6.4 g/dL (ref 6.0–8.3)

## 2019-02-28 MED ORDER — LINACLOTIDE 145 MCG PO CAPS: 145.0000 ug | ORAL_CAPSULE | Freq: Every day | ORAL | 3 refills | Status: DC

## 2019-02-28 MED ORDER — VENLAFAXINE HCL ER 37.5 MG PO CP24: 37.5000 mg | ORAL_CAPSULE | Freq: Every day | ORAL | 1 refills | Status: DC

## 2019-02-28 MED ORDER — VALSARTAN 160 MG PO TABS: 160.0000 mg | ORAL_TABLET | Freq: Every day | ORAL | 3 refills | Status: DC

## 2019-02-28 MED ORDER — AMLODIPINE BESYLATE 10 MG PO TABS: 10.0000 mg | ORAL_TABLET | Freq: Every day | ORAL | 3 refills | Status: DC

## 2019-02-28 NOTE — Assessment & Plan Note (Signed)
Report increased irritability, headache, and elevated BP with Menstrual cycle. LMP 02/28/2019, regular, s/p tubal ligation  Unable to tolerate prozac in past

## 2019-02-28 NOTE — Assessment & Plan Note (Signed)
Stable cbc and iron panel 

## 2019-02-28 NOTE — Progress Notes (Signed)
Subjective:  Patient ID: Jane Hampton, female    DOB: [birth year]  Age: 48 y.o. MRN: [id number]  CC: Follow-up (medications reflls/not fasting---request flu shot)  HPI HTN: Elevated today. She reports it is related to menstrual cycle which started today. BP Readings from Last 3 Encounters:  02/28/19 (!) 168/92  10/20/18 (!) 155/93  12/24/17 136/84   Premenstrual dysphoric disorder: Symptoms: increased irritability, headache, and elevated BP with Menstrual cycle. LMP 02/28/2019, regular, s/p tubal ligation  Unable to tolerate prozac in past.  Reviewed past Medical, Social and Family history today.  Outpatient Medications Prior to Visit  Medication Sig Dispense Refill  . ibuprofen (ADVIL,MOTRIN) 800 MG tablet   0  . amLODipine (NORVASC) 10 MG tablet Take 1 tablet (10 mg total) by mouth at bedtime. 90 tablet 3  . linaclotide (LINZESS) 145 MCG CAPS capsule Take 1 capsule (145 mcg total) by mouth daily before breakfast. 90 capsule 0  . valsartan (DIOVAN) 160 MG tablet Take 1 tablet (160 mg total) by mouth daily. 90 tablet 1  . predniSONE (STERAPRED UNI-PAK 21 TAB) 10 MG (21) TBPK tablet Take as written (Patient not taking: Reported on 02/28/2019) 21 tablet 0   No facility-administered medications prior to visit.     ROS See HPI  Objective:  BP (!) 168/92   Pulse 82   Temp (!) 96.5 F (35.8 C) (Tympanic)   Ht 5\' 2"  (1.575 m)   Wt 194 lb 3.2 oz (88.1 kg)   LMP 02/28/2019   SpO2 98%   BMI 35.52 kg/m   BP Readings from Last 3 Encounters:  02/28/19 (!) 168/92  10/20/18 (!) 155/93  12/24/17 136/84   Wt Readings from Last 3 Encounters:  02/28/19 194 lb 3.2 oz (88.1 kg)  12/24/17 183 lb (83 kg)  02/02/17 193 lb (87.5 kg)   Physical Exam Vitals signs reviewed.  HENT:     Head: Normocephalic.  Neck:     Musculoskeletal: Normal range of motion and neck supple.  Cardiovascular:     Rate and Rhythm: Normal rate and regular rhythm.     Pulses: Normal pulses.   Heart sounds: Normal heart sounds.  Musculoskeletal:     Right lower leg: No edema.     Left lower leg: No edema.  Neurological:     Mental Status: She is alert and oriented to person, place, and time.     Cranial Nerves: No cranial nerve deficit.  Psychiatric:        Mood and Affect: Mood normal.        Behavior: Behavior normal.        Thought Content: Thought content normal.     Lab Results  Component Value Date   WBC 6.8 12/24/2017   HGB 13.4 12/24/2017   HCT 40.1 12/24/2017   PLT 313.0 12/24/2017   GLUCOSE 78 12/24/2017   CHOL 165 01/05/2017   TRIG 110.0 01/05/2017   HDL 62.60 01/05/2017   LDLCALC 80 01/05/2017   ALT 18 01/05/2017   AST 24 01/05/2017   NA 138 12/24/2017   K 3.5 12/24/2017   CL 105 12/24/2017   CREATININE 0.83 12/24/2017   BUN 16 12/24/2017   CO2 26 12/24/2017   TSH 0.39 12/24/2017   HGBA1C 5.7 01/05/2017   MICROALBUR 4.40 (H) 08/24/2008   Assessment & Plan:   Jane Hampton was seen today for follow-up.  Diagnoses and all orders for this visit:  Hyperglycemia -     Hemoglobin A1c -  Hepatic function panel  Essential hypertension -     amLODipine (NORVASC) 10 MG tablet; Take 1 tablet (10 mg total) by mouth at bedtime. -     valsartan (DIOVAN) 160 MG tablet; Take 1 tablet (160 mg total) by mouth daily. -     Basic metabolic panel -     Hepatic function panel  Iron deficiency anemia due to chronic blood loss -     CBC w/Diff  Encounter for lipid screening for cardiovascular disease -     Lipid panel  PMDD (premenstrual dysphoric disorder) -     venlafaxine XR (EFFEXOR-XR) 37.5 MG 24 hr capsule; Take 1 capsule (37.5 mg total) by mouth daily with breakfast.  Need for influenza vaccination -     Flu Vaccine QUAD 36+ mos IM  Chronic idiopathic constipation -     linaclotide (LINZESS) 145 MCG CAPS capsule; Take 1 capsule (145 mcg total) by mouth daily before breakfast.   I have discontinued Monasia Mccarey's predniSONE. I am also  having her start on venlafaxine XR. Additionally, I am having her maintain her ibuprofen, amLODipine, valsartan, and linaclotide.  Meds ordered this encounter  Medications  . amLODipine (NORVASC) 10 MG tablet    Sig: Take 1 tablet (10 mg total) by mouth at bedtime.    Dispense:  90 tablet    Refill:  3    Order Specific Question:   Supervising Provider    Answer:   Dianne Dun [[id number]]  . valsartan (DIOVAN) 160 MG tablet    Sig: Take 1 tablet (160 mg total) by mouth daily.    Dispense:  90 tablet    Refill:  3    Order Specific Question:   Supervising Provider    Answer:   Dianne Dun [[id number]]  . venlafaxine XR (EFFEXOR-XR) 37.5 MG 24 hr capsule    Sig: Take 1 capsule (37.5 mg total) by mouth daily with breakfast.    Dispense:  15 capsule    Refill:  1    Order Specific Question:   Supervising Provider    Answer:   Dianne Dun [[id number]]  . linaclotide (LINZESS) 145 MCG CAPS capsule    Sig: Take 1 capsule (145 mcg total) by mouth daily before breakfast.    Dispense:  90 capsule    Refill:  3    Order Specific Question:   Supervising Provider    Answer:   Dianne Dun [[id number]]    Problem List Items Addressed This Visit      Cardiovascular and Mediastinum   Essential hypertension   Relevant Medications   amLODipine (NORVASC) 10 MG tablet   valsartan (DIOVAN) 160 MG tablet   Other Relevant Orders   Basic metabolic panel   Hepatic function panel     Digestive   Chronic idiopathic constipation   Relevant Medications   linaclotide (LINZESS) 145 MCG CAPS capsule     Other   Hyperglycemia - Primary   Relevant Orders   Hemoglobin A1c   Hepatic function panel   Iron deficiency anemia due to chronic blood loss    Stable cbc and iron panel      Relevant Orders   CBC w/Diff   PMDD (premenstrual dysphoric disorder)    Report increased irritability, headache, and elevated BP with Menstrual cycle. LMP 02/28/2019, regular, s/p tubal ligation  Unable to tolerate prozac in past       Relevant Medications   venlafaxine XR (EFFEXOR-XR) 37.5 MG 24 hr capsule  Other Visit Diagnoses    Encounter for lipid screening for cardiovascular disease       Relevant Orders   Lipid panel   Need for influenza vaccination       Relevant Orders   Flu Vaccine QUAD 36+ mos IM (Completed)       Follow-up: Return in about 1 week (around 03/07/2019) for prementrual dysphoria and HTN.  Wilfred Lacy, NP

## 2019-02-28 NOTE — Assessment & Plan Note (Signed)
>>  ASSESSMENT AND PLAN FOR PMDD (PREMENSTRUAL DYSPHORIC DISORDER) WRITTEN ON 02/28/2019 11:45 AM BY Sebastyan Snodgrass LUM, NP  Report increased irritability, headache, and elevated BP with Menstrual cycle. LMP 02/28/2019, regular, s/p tubal ligation  Unable to tolerate prozac  in past

## 2019-02-28 NOTE — Patient Instructions (Addendum)
Stable hgbA1c at 5.6 Stable cbc, renal and liver function. Need to continue multivitamin with iron 1tab daily Normal lipid panel except mild increase in triglyceride due to non fasting state. Will repeat lipid panel in 1year (fasting) F/up in 1week as discussed for HTN eval  Start effexor once daily in AM Premedicate with ibuprofen 400mg  daily 3days prior to cycle. F/up in 2weeks

## 2019-03-01 LAB — HEMOGLOBIN A1C: Hgb A1c MFr Bld: 5.6 % (ref 4.6–6.5)

## 2019-03-06 ENCOUNTER — Telehealth: Payer: Self-pay | Admitting: Nurse Practitioner

## 2019-03-06 NOTE — Telephone Encounter (Signed)

## 2019-03-07 ENCOUNTER — Encounter: Payer: Self-pay | Admitting: Nurse Practitioner

## 2019-03-07 ENCOUNTER — Other Ambulatory Visit: Payer: Self-pay

## 2019-03-07 ENCOUNTER — Ambulatory Visit (INDEPENDENT_AMBULATORY_CARE_PROVIDER_SITE_OTHER): Payer: Managed Care, Other (non HMO) | Admitting: Nurse Practitioner

## 2019-03-07 VITALS — BP 166/98 | HR 55 | Temp 97.1°F | Ht 62.0 in | Wt 192.0 lb

## 2019-03-07 DIAGNOSIS — I10 Essential (primary) hypertension: Secondary | ICD-10-CM

## 2019-03-07 MED ORDER — TRIAMTERENE-HCTZ 37.5-25 MG PO TABS: 1.0000 | ORAL_TABLET | Freq: Every day | ORAL | 5 refills | Status: DC

## 2019-03-07 NOTE — Progress Notes (Signed)
Subjective:  Patient ID: Jane Hampton, female    DOB: 01-01-1971  Age: 48 y.o. MRN: [id number]  CC: Follow-up (2 wk follow up/ c/o fatigue--pt works 3rd shift. iron level consult?have not start effexor. )  HPI  HTN: Uncontrolled with valsartan and amlodipine. asymptomatic BP Readings from Last 3 Encounters:  03/07/19 (!) 166/98  02/28/19 (!) 168/92  10/20/18 (!) 155/93   Reviewed past Medical, Social and Family history today.  Outpatient Medications Prior to Visit  Medication Sig Dispense Refill  . amLODipine (NORVASC) 10 MG tablet Take 1 tablet (10 mg total) by mouth at bedtime. 90 tablet 3  . ibuprofen (ADVIL,MOTRIN) 800 MG tablet   0  . linaclotide (LINZESS) 145 MCG CAPS capsule Take 1 capsule (145 mcg total) by mouth daily before breakfast. 90 capsule 3  . valsartan (DIOVAN) 160 MG tablet Take 1 tablet (160 mg total) by mouth daily. 90 tablet 3  . venlafaxine XR (EFFEXOR-XR) 37.5 MG 24 hr capsule Take 1 capsule (37.5 mg total) by mouth daily with breakfast. 15 capsule 1   No facility-administered medications prior to visit.     ROS See HPI  Objective:  BP (!) 166/98   Pulse (!) 55   Temp (!) 97.1 F (36.2 C) (Tympanic)   Ht 5\' 2"  (1.575 m)   Wt 192 lb (87.1 kg)   LMP 02/28/2019   SpO2 100%   BMI 35.12 kg/m   BP Readings from Last 3 Encounters:  03/07/19 (!) 166/98  02/28/19 (!) 168/92  10/20/18 (!) 155/93   Wt Readings from Last 3 Encounters:  03/07/19 192 lb (87.1 kg)  02/28/19 194 lb 3.2 oz (88.1 kg)  12/24/17 183 lb (83 kg)   Physical Exam Constitutional:      Appearance: She is obese.  Cardiovascular:     Rate and Rhythm: Normal rate and regular rhythm.     Pulses: Normal pulses.     Heart sounds: Normal heart sounds.  Musculoskeletal:     Right lower leg: No edema.     Left lower leg: No edema.  Neurological:     Mental Status: She is alert and oriented to person, place, and time.     Lab Results  Component Value Date   WBC 5.7  02/28/2019   HGB 11.2 (L) 02/28/2019   HCT 34.1 (L) 02/28/2019   PLT 312.0 02/28/2019   GLUCOSE 109 (H) 02/28/2019   CHOL 173 02/28/2019   TRIG 166.0 (H) 02/28/2019   HDL 62.10 02/28/2019   LDLCALC 78 02/28/2019   ALT 31 02/28/2019   AST 29 02/28/2019   NA 138 02/28/2019   K 3.5 02/28/2019   CL 104 02/28/2019   CREATININE 0.68 02/28/2019   BUN 9 02/28/2019   CO2 28 02/28/2019   TSH 0.39 12/24/2017   HGBA1C 5.6 02/28/2019   MICROALBUR 4.40 (H) 08/24/2008    Assessment & Plan:   Jane Hampton was seen today for follow-up.  Diagnoses and all orders for this visit:  Essential hypertension -     triamterene-hydrochlorothiazide (MAXZIDE-25) 37.5-25 MG tablet; Take 1 tablet by mouth daily.   I am having Jane Hampton start on triamterene-hydrochlorothiazide. I am also having her maintain her ibuprofen, amLODipine, valsartan, venlafaxine XR, and linaclotide.  Meds ordered this encounter  Medications  . triamterene-hydrochlorothiazide (MAXZIDE-25) 37.5-25 MG tablet    Sig: Take 1 tablet by mouth daily.    Dispense:  30 tablet    Refill:  5    Order Specific Question:  Supervising Provider    Answer:   Dianne Dun [[id number]]    Problem List Items Addressed This Visit      Cardiovascular and Mediastinum   Essential hypertension - Primary    Uncontrolled with valsartan and amlodipine BP Readings from Last 3 Encounters:  03/07/19 (!) 166/98  02/28/19 (!) 168/92  10/20/18 (!) 155/93   Start maxzide. Advised about DASH diet. Consider renal duplex, eval for sleep apnea, and/or ref to cardiology if no improvement. F/up in 36month      Relevant Medications   triamterene-hydrochlorothiazide (MAXZIDE-25) 37.5-25 MG tablet      Follow-up: Return in about 4 weeks (around 04/04/2019) for HTN (F2F, repeat BMP).  Alysia Penna, NP

## 2019-03-07 NOTE — Patient Instructions (Signed)
Continue valsartan and amlodipine Start effexor and maxzide. Take both in evening with amlodipine.  Maintain DASH diet.  DASH Eating Plan DASH stands for "Dietary Approaches to Stop Hypertension." The DASH eating plan is a healthy eating plan that has been shown to reduce high blood pressure (hypertension). It may also reduce your risk for type 2 diabetes, heart disease, and stroke. The DASH eating plan may also help with weight loss. What are tips for following this plan?  General guidelines  Avoid eating more than 2,300 mg (milligrams) of salt (sodium) a day. If you have hypertension, you may need to reduce your sodium intake to 1,500 mg a day.  Limit alcohol intake to no more than 1 drink a day for nonpregnant women and 2 drinks a day for men. One drink equals 12 oz of beer, 5 oz of wine, or 1 oz of hard liquor.  Work with your health care provider to maintain a healthy body weight or to lose weight. Ask what an ideal weight is for you.  Get at least 30 minutes of exercise that causes your heart to beat faster (aerobic exercise) most days of the week. Activities may include walking, swimming, or biking.  Work with your health care provider or diet and nutrition specialist (dietitian) to adjust your eating plan to your individual calorie needs. Reading food labels   Check food labels for the amount of sodium per serving. Choose foods with less than 5 percent of the Daily Value of sodium. Generally, foods with less than 300 mg of sodium per serving fit into this eating plan.  To find whole grains, look for the word "whole" as the first word in the ingredient list. Shopping  Buy products labeled as "low-sodium" or "no salt added."  Buy fresh foods. Avoid canned foods and premade or frozen meals. Cooking  Avoid adding salt when cooking. Use salt-free seasonings or herbs instead of table salt or sea salt. Check with your health care provider or pharmacist before using salt  substitutes.  Do not fry foods. Cook foods using healthy methods such as baking, boiling, grilling, and broiling instead.  Cook with heart-healthy oils, such as olive, canola, soybean, or sunflower oil. Meal planning  Eat a balanced diet that includes: ? 5 or more servings of fruits and vegetables each day. At each meal, try to fill half of your plate with fruits and vegetables. ? Up to 6-8 servings of whole grains each day. ? Less than 6 oz of lean meat, poultry, or fish each day. A 3-oz serving of meat is about the same size as a deck of cards. One egg equals 1 oz. ? 2 servings of low-fat dairy each day. ? A serving of nuts, seeds, or beans 5 times each week. ? Heart-healthy fats. Healthy fats called Omega-3 fatty acids are found in foods such as flaxseeds and coldwater fish, like sardines, salmon, and mackerel.  Limit how much you eat of the following: ? Canned or prepackaged foods. ? Food that is high in trans fat, such as fried foods. ? Food that is high in saturated fat, such as fatty meat. ? Sweets, desserts, sugary drinks, and other foods with added sugar. ? Full-fat dairy products.  Do not salt foods before eating.  Try to eat at least 2 vegetarian meals each week.  Eat more home-cooked food and less restaurant, buffet, and fast food.  When eating at a restaurant, ask that your food be prepared with less salt or no salt, if  possible. What foods are recommended? The items listed may not be a complete list. Talk with your dietitian about what dietary choices are best for you. Grains Whole-grain or whole-wheat bread. Whole-grain or whole-wheat pasta. Brown rice. [place]. Bulgur. Whole-grain and low-sodium cereals. Pita bread. Low-fat, low-sodium crackers. Whole-wheat flour tortillas. Vegetables Fresh or frozen vegetables (raw, steamed, roasted, or grilled). Low-sodium or reduced-sodium tomato and vegetable juice. Low-sodium or reduced-sodium tomato sauce and tomato  paste. Low-sodium or reduced-sodium canned vegetables. Fruits All fresh, dried, or frozen fruit. Canned fruit in natural juice (without added sugar). Meat and other protein foods Skinless chicken or [place]. Ground chicken or [place]. Pork with fat trimmed off. Fish and seafood. Egg whites. Dried beans, peas, or lentils. Unsalted nuts, nut butters, and seeds. Unsalted canned beans. Lean cuts of beef with fat trimmed off. Low-sodium, lean deli meat. Dairy Low-fat (1%) or fat-free (skim) milk. Fat-free, low-fat, or reduced-fat cheeses. Nonfat, low-sodium ricotta or cottage cheese. Low-fat or nonfat yogurt. Low-fat, low-sodium cheese. Fats and oils Soft margarine without trans fats. Vegetable oil. Low-fat, reduced-fat, or light mayonnaise and salad dressings (reduced-sodium). Canola, safflower, olive, soybean, and sunflower oils. Avocado. Seasoning and other foods Herbs. Spices. Seasoning mixes without salt. Unsalted popcorn and pretzels. Fat-free sweets. What foods are not recommended? The items listed may not be a complete list. Talk with your dietitian about what dietary choices are best for you. Grains Baked goods made with fat, such as croissants, muffins, or some breads. Dry pasta or rice meal packs. Vegetables Creamed or fried vegetables. Vegetables in a cheese sauce. Regular canned vegetables (not low-sodium or reduced-sodium). Regular canned tomato sauce and paste (not low-sodium or reduced-sodium). Regular tomato and vegetable juice (not low-sodium or reduced-sodium). Angie Fava. Olives. Fruits Canned fruit in a light or heavy syrup. Fried fruit. Fruit in cream or butter sauce. Meat and other protein foods Fatty cuts of meat. Ribs. Fried meat. Berniece Salines. Sausage. Bologna and other processed lunch meats. Salami. Fatback. Hotdogs. Bratwurst. Salted nuts and seeds. Canned beans with added salt. Canned or smoked fish. Whole eggs or egg yolks. Chicken or [place] with skin. Dairy Whole or 2% milk,  cream, and half-and-half. Whole or full-fat cream cheese. Whole-fat or sweetened yogurt. Full-fat cheese. Nondairy creamers. Whipped toppings. Processed cheese and cheese spreads. Fats and oils Butter. Stick margarine. Lard. Shortening. Ghee. Bacon fat. Tropical oils, such as coconut, palm kernel, or palm oil. Seasoning and other foods Salted popcorn and pretzels. Onion salt, garlic salt, seasoned salt, table salt, and sea salt. Worcestershire sauce. Tartar sauce. Barbecue sauce. Teriyaki sauce. Soy sauce, including reduced-sodium. Steak sauce. Canned and packaged gravies. Fish sauce. Oyster sauce. Cocktail sauce. Horseradish that you find on the shelf. Ketchup. Mustard. Meat flavorings and tenderizers. Bouillon cubes. Hot sauce and Tabasco sauce. Premade or packaged marinades. Premade or packaged taco seasonings. Relishes. Regular salad dressings. Where to find more information:  National Heart, Lung, and [place]: https://wilson-eaton.com/  American Heart Association: www.heart.org Summary  The DASH eating plan is a healthy eating plan that has been shown to reduce high blood pressure (hypertension). It may also reduce your risk for type 2 diabetes, heart disease, and stroke.  With the DASH eating plan, you should limit salt (sodium) intake to 2,300 mg a day. If you have hypertension, you may need to reduce your sodium intake to 1,500 mg a day.  When on the DASH eating plan, aim to eat more fresh fruits and vegetables, whole grains, lean proteins, low-fat dairy, and heart-healthy fats.  Work with your health care provider or diet and nutrition specialist (dietitian) to adjust your eating plan to your individual calorie needs. This information is not intended to replace advice given to you by your health care provider. Make sure you discuss any questions you have with your health care provider. Document Released: 04/02/2011 Document Revised: 03/26/2017 Document Reviewed: 04/06/2016 Elsevier  Patient Education  2020 [employer].

## 2019-03-07 NOTE — Assessment & Plan Note (Addendum)
Uncontrolled with valsartan and amlodipine BP Readings from Last 3 Encounters:  03/07/19 (!) 166/98  02/28/19 (!) 168/92  10/20/18 (!) 155/93   Start maxzide. Advised about DASH diet. Consider medication compliance, pheochromocytoma and/or ref to cardiology if no improvement? F/up in 22month

## 2019-03-13 ENCOUNTER — Other Ambulatory Visit: Payer: Self-pay | Admitting: Nurse Practitioner

## 2019-03-13 ENCOUNTER — Ambulatory Visit: Payer: Managed Care, Other (non HMO) | Admitting: Nurse Practitioner

## 2019-03-13 DIAGNOSIS — F3281 Premenstrual dysphoric disorder: Secondary | ICD-10-CM

## 2019-04-04 ENCOUNTER — Other Ambulatory Visit: Payer: Self-pay

## 2019-04-05 ENCOUNTER — Encounter: Payer: Self-pay | Admitting: Nurse Practitioner

## 2019-04-05 ENCOUNTER — Ambulatory Visit (INDEPENDENT_AMBULATORY_CARE_PROVIDER_SITE_OTHER): Payer: Managed Care, Other (non HMO) | Admitting: Nurse Practitioner

## 2019-04-05 VITALS — BP 158/84 | HR 68 | Temp 96.2°F | Ht 62.0 in | Wt 187.8 lb

## 2019-04-05 DIAGNOSIS — I10 Essential (primary) hypertension: Secondary | ICD-10-CM | POA: Diagnosis not present

## 2019-04-05 NOTE — Assessment & Plan Note (Signed)
Admits to taking dietary supplement: Renu Herb slim plus x 1year. Reports compliance with medications (valsartan in Am, maxzide and amlodipine in PM) and DASH diet. 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 BP Readings from Last 3 Encounters:  04/05/19 (!) 158/84  03/07/19 (!) 166/98  02/28/19 (!) 168/92   Advised to discontinue dietary supplement. Advised about possible complications from prolonged uncontrolled HTN. Ordered renal duplex F/up in 2weeks. If no improvement and normal renal duplex: increase valsartan to 320mg , ref to cardiology, and order sleep study.

## 2019-04-05 NOTE — Progress Notes (Signed)
Subjective:  Patient ID: Jane Hampton, female    DOB: [birth year]  Age: 48 y.o. MRN: [id number]  CC: Follow-up (follow up on BP)  HPI HTN: Admits to taking dietary supplement: Renu Herb slim plus x 1year. Reports compliance with medications (valsartan in Am, maxzide and amlodipine in PM) and DASH diet. 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 BP Readings from Last 3 Encounters:  04/05/19 (!) 158/84  03/07/19 (!) 166/98  02/28/19 (!) 168/92   Reviewed past Medical, Social and Family history today.  Outpatient Medications Prior to Visit  Medication Sig Dispense Refill  . amLODipine (NORVASC) 10 MG tablet Take 1 tablet (10 mg total) by mouth at bedtime. 90 tablet 3  . ibuprofen (ADVIL,MOTRIN) 800 MG tablet   0  . linaclotide (LINZESS) 145 MCG CAPS capsule Take 1 capsule (145 mcg total) by mouth daily before breakfast. 90 capsule 3  . triamterene-hydrochlorothiazide (MAXZIDE-25) 37.5-25 MG tablet Take 1 tablet by mouth daily. 30 tablet 5  . valsartan (DIOVAN) 160 MG tablet Take 1 tablet (160 mg total) by mouth daily. 90 tablet 3  . venlafaxine XR (EFFEXOR-XR) 37.5 MG 24 hr capsule Take 1 capsule (37.5 mg total) by mouth daily with breakfast. 15 capsule 1   No facility-administered medications prior to visit.     ROS See HPI  Objective:  BP (!) 158/84   Pulse 68   Temp (!) 96.2 F (35.7 C) (Tympanic)   Ht 5\' 2"  (1.575 m)   Wt 187 lb 12.8 oz (85.2 kg)   BMI 34.35 kg/m   BP Readings from Last 3 Encounters:  04/05/19 (!) 158/84  03/07/19 (!) 166/98  02/28/19 (!) 168/92    Wt Readings from Last 3 Encounters:  04/05/19 187 lb 12.8 oz (85.2 kg)  03/07/19 192 lb (87.1 kg)  02/28/19 194 lb 3.2 oz (88.1 kg)    Physical Exam Vitals signs reviewed.  Cardiovascular:     Rate  and Rhythm: Normal rate and regular rhythm.     Pulses: Normal pulses.     Heart sounds: Normal heart sounds.  Musculoskeletal:     Right lower leg: No edema.     Left lower leg: No edema.  Neurological:     Mental Status: She is alert and oriented to person, place, and time.    Lab Results  Component Value Date   WBC 5.7 02/28/2019   HGB 11.2 (L) 02/28/2019   HCT 34.1 (L) 02/28/2019   PLT 312.0 02/28/2019   GLUCOSE 109 (H) 02/28/2019   CHOL 173 02/28/2019   TRIG 166.0 (H) 02/28/2019   HDL 62.10 02/28/2019   LDLCALC 78 02/28/2019   ALT 31 02/28/2019   AST 29 02/28/2019   NA 138 02/28/2019   K 3.5 02/28/2019   CL 104 02/28/2019   CREATININE 0.68 02/28/2019   BUN 9 02/28/2019   CO2 28 02/28/2019   TSH 0.39 12/24/2017   HGBA1C 5.6 02/28/2019   MICROALBUR 4.40 (H) 08/24/2008    Assessment & Plan:  This visit occurred during the SARS-CoV-2 public health emergency.  Safety protocols were in place, including screening questions prior to the visit, additional usage of staff PPE, and extensive cleaning of exam room while observing appropriate contact time as indicated for disinfecting solutions.   Jane Hampton was seen today for  follow-up.  Diagnoses and all orders for this visit:  Resistant hypertension -     VAS US RENAL ARTERY DUPLEX; Future   I am having Jane Hampton maintain her ibuprofen, amLODipine, valsartan, venlafaxine XR, linaclotide, and triamterene-hydrochlorothiazide.  No orders of the defined types were placed in this encounter.   Problem List Items Addressed This Visit      Cardiovascular and Mediastinum   Essential hypertension - Primary    Admits to taking dietary supplement: Renu Herb slim plus x 1year. Reports compliance with medications (valsartan in Am, maxzide and amlodipine in PM) and DASH diet. 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 BP Readings from Last 3 Encounters:  04/05/19 (!) 158/84  03/07/19 (!) 166/98  02/28/19 (!) 168/92   Advised to discontinue dietary supplement. Advised about possible complications from prolonged uncontrolled HTN. Ordered renal duplex F/up in 2weeks. If no improvement and normal renal duplex: increase valsartan to 320mg , ref to cardiology, and order sleep study.         Follow-up: Return in about 2 weeks (around 04/19/2019) for HTN (video appt).  04/21/2019, NP

## 2019-04-05 NOTE — Patient Instructions (Signed)
Stop dietary supplement: Renu Herb  Maintain current medications. F/up in 2weeks (video appt) Check BP once a dail when you wake up and record.  You will be contacted to schedule appt for renal duplex

## 2019-04-13 ENCOUNTER — Other Ambulatory Visit: Payer: Self-pay

## 2019-04-13 ENCOUNTER — Ambulatory Visit (HOSPITAL_COMMUNITY)
Admission: RE | Admit: 2019-04-13 | Discharge: 2019-04-13 | Disposition: A | Discharge status: Home / Self Care | Payer: Managed Care, Other (non HMO) | Source: Ambulatory Visit | Attending: Family | Admitting: Family

## 2019-04-13 DIAGNOSIS — I10 Essential (primary) hypertension: Secondary | ICD-10-CM | POA: Insufficient documentation

## 2019-05-04 ENCOUNTER — Telehealth: Payer: Managed Care, Other (non HMO) | Admitting: Nurse Practitioner

## 2019-08-03 ENCOUNTER — Other Ambulatory Visit: Payer: Self-pay | Admitting: Nurse Practitioner

## 2019-08-03 DIAGNOSIS — I10 Essential (primary) hypertension: Secondary | ICD-10-CM

## 2019-08-03 NOTE — Telephone Encounter (Signed)
Patient is calling and wanted to speak someone regarding a PA for medication. CB is [phone]

## 2019-08-03 NOTE — Telephone Encounter (Signed)
Pt was notified and understood she needed an appointment for refills. Pt scheduled for 08/04/19.

## 2019-08-04 ENCOUNTER — Telehealth: Payer: Medicaid Other | Admitting: Nurse Practitioner

## 2019-08-04 ENCOUNTER — Encounter: Payer: Self-pay | Admitting: Nurse Practitioner

## 2019-08-04 ENCOUNTER — Telehealth (INDEPENDENT_AMBULATORY_CARE_PROVIDER_SITE_OTHER): Payer: 59 | Admitting: Nurse Practitioner

## 2019-08-04 ENCOUNTER — Other Ambulatory Visit: Payer: Self-pay

## 2019-08-04 VITALS — BP 152/70 | Temp 97.2°F | Ht 62.0 in | Wt 180.0 lb

## 2019-08-04 DIAGNOSIS — I10 Essential (primary) hypertension: Secondary | ICD-10-CM | POA: Diagnosis not present

## 2019-08-04 DIAGNOSIS — K5904 Chronic idiopathic constipation: Secondary | ICD-10-CM | POA: Diagnosis not present

## 2019-08-04 DIAGNOSIS — F3281 Premenstrual dysphoric disorder: Secondary | ICD-10-CM

## 2019-08-04 MED ORDER — TRIAMTERENE-HCTZ 37.5-25 MG PO TABS: 1.0000 | ORAL_TABLET | Freq: Every day | ORAL | 1 refills | Status: DC

## 2019-08-04 MED ORDER — LINACLOTIDE 145 MCG PO CAPS: 145.0000 ug | ORAL_CAPSULE | Freq: Every day | ORAL | 3 refills | Status: DC

## 2019-08-04 NOTE — Patient Instructions (Signed)
Bring home machine to next OV Maintain current medications

## 2019-08-04 NOTE — Assessment & Plan Note (Signed)
Reports improved BP reading with discontinuation of dietary supplement. She reports home readings 140s-150s/80s Denies any headache or dizziness or LE edema or palpitations BP Readings from Last 3 Encounters:  08/04/19 (!) 152/70  04/05/19 (!) 158/84  03/07/19 (!) 166/98   She is to bring home BP machine to next OV F/up in 62month F2F Consider sleep study?

## 2019-08-04 NOTE — Progress Notes (Signed)
Virtual Visit via Video Note  I connected with@ on 08/04/19 at 11:30 AM EDT by a video enabled telemedicine application and verified that I am speaking with the correct person using two identifiers.  Location: Patient:Home Provider: Office Participants: patient and provider  I discussed the limitations of evaluation and management by telemedicine and the availability of in person appointments. I also discussed with the patient that there may be a patient responsible charge related to this service. The patient expressed understanding and agreed to proceed.  AJ:OINOMV up on BP--and med refill for linzess and BP meds. no reading to share--pt is at work  History of Present Illness: HTN: Reports improved BP reading with discontinuation of dietary supplement. She reports home readings 140s-150s/80s Denies any headache or dizziness or LE edema or palpitations BP Readings from Last 3 Encounters:  08/04/19 (!) 152/70  04/05/19 (!) 158/84  03/07/19 (!) 166/98   Observations/Objective: Physical Exam  Constitutional: She is oriented to person, place, and time.  Pulmonary/Chest: Effort normal.  Neurological: She is alert and oriented to person, place, and time.  Vitals reviewed.  Assessment and Plan: Jane Hampton was seen today for follow-up.  Diagnoses and all orders for this visit:  Essential hypertension -     triamterene-hydrochlorothiazide (MAXZIDE-25) 37.5-25 MG tablet; Take 1 tablet by mouth daily.   Follow Up Instructions: See avs   I discussed the assessment and treatment plan with the patient. The patient was provided an opportunity to ask questions and all were answered. The patient agreed with the plan and demonstrated an understanding of the instructions.   The patient was advised to call back or seek an in-person evaluation if the symptoms worsen or if the condition fails to improve as anticipated.   Jane Penna, NP

## 2019-08-07 ENCOUNTER — Telehealth: Payer: Self-pay | Admitting: Nurse Practitioner

## 2019-08-07 NOTE — Telephone Encounter (Signed)
PA started for Linzess 145 CAP waiting for result from insurance.   Jane Hampton (Key: [id number])  BIN  [id number] PCN  [id number] GRP RX Glo Jane Hampton

## 2019-08-07 NOTE — Telephone Encounter (Signed)
Patient is calling and requesting a call back regarding medication. CB is [phone]

## 2019-08-07 NOTE — Telephone Encounter (Signed)
PA approved, notified pharmacy and pt is aware

## 2019-08-08 ENCOUNTER — Ambulatory Visit: Payer: Medicaid Other | Admitting: Nurse Practitioner

## 2019-09-05 ENCOUNTER — Ambulatory Visit: Payer: 59 | Admitting: Nurse Practitioner

## 2019-09-05 ENCOUNTER — Other Ambulatory Visit: Payer: Self-pay

## 2019-09-05 ENCOUNTER — Encounter: Payer: Self-pay | Admitting: Nurse Practitioner

## 2019-09-05 VITALS — BP 138/82 | HR 68 | Temp 96.4°F | Ht 62.0 in | Wt 181.6 lb

## 2019-09-05 DIAGNOSIS — I10 Essential (primary) hypertension: Secondary | ICD-10-CM

## 2019-09-05 NOTE — Assessment & Plan Note (Signed)
Improved BP with maxzide, valsartan and amlodipine Denies any adverse side effects. BP Readings from Last 3 Encounters:  09/05/19 138/82  08/04/19 (!) 152/70  04/05/19 (!) 158/84

## 2019-09-05 NOTE — Patient Instructions (Addendum)
Maintain current medications Monitor BP at lease 2x/week and record. Call office sooner if BP>150/90. F/up in 85months (fasting for CPE)

## 2019-09-05 NOTE — Progress Notes (Signed)
Subjective:  Patient ID: Jane Hampton, female    DOB: 03-09-1971  Age: 49 y.o. MRN: [id number]  CC: Follow-up (HTN-forgot to bring machine//pt reports BP staying around 145-150/70//)  HPI HTN: mproved BP with maxzide, valsartan and amlodipine Denies any adverse side effects. BP Readings from Last 3 Encounters:  09/05/19 138/82  08/04/19 (!) 152/70  04/05/19 (!) 158/84   Reviewed past Medical, Social and Family history today.  Outpatient Medications Prior to Visit  Medication Sig Dispense Refill  . amLODipine (NORVASC) 10 MG tablet Take 1 tablet (10 mg total) by mouth at bedtime. 90 tablet 3  . ibuprofen (ADVIL,MOTRIN) 800 MG tablet   0  . linaclotide (LINZESS) 145 MCG CAPS capsule Take 1 capsule (145 mcg total) by mouth daily before breakfast. 90 capsule 3  . triamterene-hydrochlorothiazide (MAXZIDE-25) 37.5-25 MG tablet Take 1 tablet by mouth daily. 90 tablet 1  . valsartan (DIOVAN) 160 MG tablet Take 1 tablet (160 mg total) by mouth daily. 90 tablet 3  . venlafaxine XR (EFFEXOR-XR) 37.5 MG 24 hr capsule Take 1 capsule (37.5 mg total) by mouth once a week. Patient's preference 12 capsule 3   No facility-administered medications prior to visit.    ROS See HPI  Objective:  BP 138/82   Pulse 68   Temp (!) 96.4 F (35.8 C) (Tympanic)   Ht 5\' 2"  (1.575 m)   Wt 181 lb 9.6 oz (82.4 kg)   SpO2 98%   BMI 33.22 kg/m   BP Readings from Last 3 Encounters:  09/05/19 138/82  08/04/19 (!) 152/70  04/05/19 (!) 158/84    Wt Readings from Last 3 Encounters:  09/05/19 181 lb 9.6 oz (82.4 kg)  08/04/19 180 lb (81.6 kg)  04/05/19 187 lb 12.8 oz (85.2 kg)    Physical Exam Cardiovascular:     Rate and Rhythm: Normal rate and regular rhythm.     Pulses: Normal pulses.     Heart sounds: Normal heart sounds.  Pulmonary:     Effort: Pulmonary effort is normal.  Musculoskeletal:     Right lower leg: No edema.     Left lower leg: No edema.  Neurological:     Mental  Status: She is alert and oriented to person, place, and time.    Lab Results  Component Value Date   WBC 5.7 02/28/2019   HGB 11.2 (L) 02/28/2019   HCT 34.1 (L) 02/28/2019   PLT 312.0 02/28/2019   GLUCOSE 109 (H) 02/28/2019   CHOL 173 02/28/2019   TRIG 166.0 (H) 02/28/2019   HDL 62.10 02/28/2019   LDLCALC 78 02/28/2019   ALT 31 02/28/2019   AST 29 02/28/2019   NA 138 02/28/2019   K 3.5 02/28/2019   CL 104 02/28/2019   CREATININE 0.68 02/28/2019   BUN 9 02/28/2019   CO2 28 02/28/2019   TSH 0.39 12/24/2017   HGBA1C 5.6 02/28/2019   MICROALBUR 4.40 (H) 08/24/2008   Assessment & Plan:  This visit occurred during the SARS-CoV-2 public health emergency.  Safety protocols were in place, including screening questions prior to the visit, additional usage of staff PPE, and extensive cleaning of exam room while observing appropriate contact time as indicated for disinfecting solutions.   Jane Hampton was seen today for follow-up.  Diagnoses and all orders for this visit:  Essential hypertension   I am having Jane Hampton maintain her ibuprofen, amLODipine, valsartan, triamterene-hydrochlorothiazide, linaclotide, and venlafaxine XR.  No orders of the defined types were placed in this encounter.  Problem List Items Addressed This Visit      Cardiovascular and Mediastinum   Essential hypertension - Primary    Improved BP with maxzide, valsartan and amlodipine Denies any adverse side effects. BP Readings from Last 3 Encounters:  09/05/19 138/82  08/04/19 (!) 152/70  04/05/19 (!) 158/84            Follow-up: Return in about 6 months (around 03/07/2020) for CPE (fasting).  Wilfred Lacy, NP

## 2019-12-20 ENCOUNTER — Other Ambulatory Visit: Payer: Self-pay | Admitting: Nurse Practitioner

## 2019-12-20 DIAGNOSIS — Z1231 Encounter for screening mammogram for malignant neoplasm of breast: Secondary | ICD-10-CM

## 2020-01-05 ENCOUNTER — Ambulatory Visit
Admission: RE | Admit: 2020-01-05 | Discharge: 2020-01-05 | Disposition: A | Discharge status: Home / Self Care | Payer: 59 | Source: Ambulatory Visit | Attending: Nurse Practitioner | Admitting: Nurse Practitioner

## 2020-01-05 ENCOUNTER — Other Ambulatory Visit: Payer: Self-pay

## 2020-01-05 DIAGNOSIS — Z1231 Encounter for screening mammogram for malignant neoplasm of breast: Secondary | ICD-10-CM

## 2020-02-01 ENCOUNTER — Other Ambulatory Visit: Payer: Self-pay | Admitting: Nurse Practitioner

## 2020-02-01 DIAGNOSIS — I10 Essential (primary) hypertension: Secondary | ICD-10-CM

## 2020-03-06 ENCOUNTER — Other Ambulatory Visit: Payer: Self-pay

## 2020-03-07 ENCOUNTER — Encounter: Payer: Self-pay | Admitting: Nurse Practitioner

## 2020-03-07 ENCOUNTER — Ambulatory Visit (INDEPENDENT_AMBULATORY_CARE_PROVIDER_SITE_OTHER): Payer: 59 | Admitting: Nurse Practitioner

## 2020-03-07 ENCOUNTER — Other Ambulatory Visit (HOSPITAL_COMMUNITY)
Admission: RE | Admit: 2020-03-07 | Discharge: 2020-03-07 | Disposition: A | Discharge status: Home / Self Care | Payer: 59 | Source: Ambulatory Visit | Attending: Nurse Practitioner | Admitting: Nurse Practitioner

## 2020-03-07 VITALS — BP 126/80 | HR 59 | Temp 97.8°F | Ht 65.0 in | Wt 181.2 lb

## 2020-03-07 DIAGNOSIS — Z0001 Encounter for general adult medical examination with abnormal findings: Secondary | ICD-10-CM | POA: Insufficient documentation

## 2020-03-07 DIAGNOSIS — Z124 Encounter for screening for malignant neoplasm of cervix: Secondary | ICD-10-CM | POA: Insufficient documentation

## 2020-03-07 DIAGNOSIS — I10 Essential (primary) hypertension: Secondary | ICD-10-CM

## 2020-03-07 DIAGNOSIS — Z136 Encounter for screening for cardiovascular disorders: Secondary | ICD-10-CM | POA: Diagnosis not present

## 2020-03-07 DIAGNOSIS — Z0000 Encounter for general adult medical examination without abnormal findings: Secondary | ICD-10-CM | POA: Diagnosis not present

## 2020-03-07 DIAGNOSIS — K644 Residual hemorrhoidal skin tags: Secondary | ICD-10-CM | POA: Diagnosis not present

## 2020-03-07 DIAGNOSIS — D500 Iron deficiency anemia secondary to blood loss (chronic): Secondary | ICD-10-CM | POA: Diagnosis not present

## 2020-03-07 DIAGNOSIS — R739 Hyperglycemia, unspecified: Secondary | ICD-10-CM | POA: Diagnosis not present

## 2020-03-07 DIAGNOSIS — Z13220 Encounter for screening for lipoid disorders: Secondary | ICD-10-CM | POA: Diagnosis not present

## 2020-03-07 LAB — COMPREHENSIVE METABOLIC PANEL
ALT: 21 U/L (ref 0–35)
AST: 23 U/L (ref 0–37)
Albumin: 4.1 g/dL (ref 3.5–5.2)
Alkaline Phosphatase: 46 U/L (ref 39–117)
BUN: 15 mg/dL (ref 6–23)
CO2: 29 mEq/L (ref 19–32)
Calcium: 9.5 mg/dL (ref 8.4–10.5)
Chloride: 101 mEq/L (ref 96–112)
Creatinine, Ser: 0.92 mg/dL (ref 0.40–1.20)
GFR: 73.86 mL/min (ref >60.00)
Glucose, Bld: 76 mg/dL (ref 70–99)
Potassium: 3.5 mEq/L (ref 3.5–5.1)
Sodium: 135 mEq/L (ref 135–145)
Total Bilirubin: 0.2 mg/dL (ref 0.2–1.2)
Total Protein: 6.8 g/dL (ref 6.0–8.3)

## 2020-03-07 LAB — LIPID PANEL
Cholesterol: 161 mg/dL (ref 0–200)
HDL: 71 mg/dL (ref >39.00)
LDL Cholesterol: 79 mg/dL (ref 0–99)
NonHDL: 90.3
Total CHOL/HDL Ratio: 2
Triglycerides: 59 mg/dL (ref 0.0–149.0)
VLDL: 11.8 mg/dL (ref 0.0–40.0)

## 2020-03-07 LAB — CBC WITH DIFFERENTIAL/PLATELET
Basophils Absolute: 0.1 10*3/uL (ref 0.0–0.1)
Basophils Relative: 2.5 % (ref 0.0–3.0)
Eosinophils Absolute: 0.2 10*3/uL (ref 0.0–0.7)
Eosinophils Relative: 5.2 % — ABNORMAL HIGH (ref 0.0–5.0)
HCT: 27.8 % — ABNORMAL LOW (ref 36.0–46.0)
Hemoglobin: 9.1 g/dL — ABNORMAL LOW (ref 12.0–15.0)
Lymphocytes Relative: 33 % (ref 12.0–46.0)
Lymphs Abs: 1.6 10*3/uL (ref 0.7–4.0)
MCHC: 32.7 g/dL (ref 30.0–36.0)
MCV: 80.2 fl (ref 78.0–100.0)
Monocytes Absolute: 0.5 10*3/uL (ref 0.1–1.0)
Monocytes Relative: 10.6 % (ref 3.0–12.0)
Neutro Abs: 2.3 10*3/uL (ref 1.4–7.7)
Neutrophils Relative %: 48.7 % (ref 43.0–77.0)
Platelets: 361 10*3/uL (ref 150.0–400.0)
RBC: 3.47 Mil/uL — ABNORMAL LOW (ref 3.87–5.11)
RDW: 17.2 % — ABNORMAL HIGH (ref 11.5–15.5)
WBC: 4.7 10*3/uL (ref 4.0–10.5)

## 2020-03-07 LAB — HEMOGLOBIN A1C: Hgb A1c MFr Bld: 5.6 % (ref 4.6–6.5)

## 2020-03-07 NOTE — Progress Notes (Signed)
Subjective:    Patient ID: Jane Hampton, female    DOB: [birth year], 49 y.o.   MRN: [id number]  Patient presents today for CPE and eval of chronic conditions  HPI Essential hypertension Improved BP with amlodipine, Maxzide and diovan. BP Readings from Last 3 Encounters:  03/07/20 126/80  09/05/19 138/82  08/04/19 (!) 152/70   Maintain current medications  Iron deficiency anemia due to chronic blood loss Cbc and iron panel indicates severe iron deficient anemia: start ferrous daily, rx sent. Return to lab for ifob kit (check stool for blood). Need to repeat iron panel in 39month Consider iron transfusion if no improvement Ref to GI if positive iFOB.  CBC    Component Value Date/Time   WBC 4.7 03/07/2020 0945   RBC 3.47 (L) 03/07/2020 0945   HGB 9.1 (L) 03/07/2020 0945   HCT 27.8 (L) 03/07/2020 0945   PLT 361.0 03/07/2020 0945   MCV 80.2 03/07/2020 0945   MCH 28.2 05/18/2011 2327   MCHC 32.7 03/07/2020 0945   RDW 17.2 (H) 03/07/2020 0945   LYMPHSABS 1.6 03/07/2020 0945   MONOABS 0.5 03/07/2020 0945   EOSABS 0.2 03/07/2020 0945   BASOSABS 0.1 03/07/2020 0945   Iron/TIBC/Ferritin/ %Sat    Component Value Date/Time   IRON 10 (L) 03/07/2020 0945   TIBC 384 03/07/2020 0945   FERRITIN 6 (L) 03/07/2020 0945   IRONPCTSAT 3 (L) 03/07/2020 0945   Sexual History (orientation,birth control, marital status, STD):due to pelvic exam, up to date with mammogram  Depression/Suicide[facility] 08/04[facility]2019 02/28/2019 12/24/2017 01/05/2017  Decreased Interest 0 0 0 0  Down, Depressed, Hopeless 0 0 0 0  PHQ - 2 Score 0 0 0 0   Vision:up to date  Dental:up to date  Immunizations: (TDAP, Hep C screen, Pneumovax, Influenza, zoster)  Health Maintenance  Topic Date Due  .  Hepatitis C: One time screening is recommended by Center for Disease Control  (CDC) for  adults born from [birth year]through 1964.   Never done  . Pap Smear  01/06/2020  . Tetanus Vaccine   04/24/2024  . Flu Shot  Completed  . COVID-19 Vaccine  Completed  . HIV Screening  Completed   Diet:regular.  Weight:  Wt Readings from Last 3 Encounters:  03/07/20 181 lb 3.2 oz (82.2 kg)  09/05/19 181 lb 9.6 oz (82.4 kg)  08/04/19 180 lb (81.6 kg)    Fall Risk: Fall Risk  08/04/2019 02/28/2019 12/24/2017 01/05/2017  Falls in the past year? 0 0 No No   Medications and allergies reviewed with patient and updated if appropriate.  Patient Active Problem List   Diagnosis Date Noted  . Heart murmur on physical examination 12/24/2017  . Iron deficiency anemia due to chronic blood loss 12/24/2017  . Chronic idiopathic constipation 12/24/2017  . Family history of aneurysm of blood vessel of brain 01/05/2017  . Encounter for Papanicolaou smear for cervical cancer screening 01/05/2017  . Hyperglycemia 01/05/2017  . PMDD (premenstrual dysphoric disorder) 09/06/2015  . Menstrual migraine 09/06/2015  . Hemorrhoid 11/06/2014  . Essential hypertension 04/25/2014    Current Outpatient Medications on File Prior to Visit  Medication Sig Dispense Refill  . amLODipine (NORVASC) 10 MG tablet Take 1 tablet (10 mg total) by mouth at bedtime. 90 tablet 3  . ibuprofen (ADVIL,MOTRIN) 800 MG tablet   0  . linaclotide (LINZESS) 145 MCG CAPS capsule Take 1 capsule (145 mcg total) by mouth daily before breakfast. 90 capsule 3  .  triamterene-hydrochlorothiazide (MAXZIDE-25) 37.5-25 MG tablet TAKE 1 TABLET BY MOUTH EVERY DAY 90 tablet 0  . valsartan (DIOVAN) 160 MG tablet Take 1 tablet (160 mg total) by mouth daily. 90 tablet 3  . [DISCONTINUED] lisinopril (PRINIVIL,ZESTRIL) 20 MG tablet Take 1 tablet (20 mg total) by mouth daily. 90 tablet 3   No current facility-administered medications on file prior to visit.    Past Medical History:  Diagnosis Date  . Hematochezia 09/06/2015  . Hypertension   . Migraines     Past Surgical History:  Procedure Laterality Date  . CESAREAN SECTION    .  TONSILLECTOMY    . TUBAL LIGATION      Social History   Socioeconomic History  . Marital status: Married    Spouse name: Not on file  . Number of children: 3  . Years of education: Not on file  . Highest education level: Not on file  Occupational History  . Occupation: Automotive engineer  Tobacco Use  . Smoking status: Former Smoker    Years: 15.00    Types: Cigarettes    Quit date: 10/02/2000    Years since quitting: 19.4  . Smokeless tobacco: Never Used  Vaping Use  . Vaping Use: Never used  Substance and Sexual Activity  . Alcohol use: No    Alcohol/week: 0.0 standard drinks  . Drug use: No  . Sexual activity: Yes    Birth control/protection: Surgical  Other Topics Concern  . Not on file  Social History Narrative  . Not on file   Social Determinants of Health   Financial Resource Strain:   . Difficulty of Paying Living Expenses: Not on file  Food Insecurity:   . Worried About Charity fundraiser in the Last Year: Not on file  . Ran Out of Food in the Last Year: Not on file  Transportation Needs:   . Lack of Transportation (Medical): Not on file  . Lack of Transportation (Non-Medical): Not on file  Physical Activity:   . Days of Exercise per Week: Not on file  . Minutes of Exercise per Session: Not on file  Stress:   . Feeling of Stress : Not on file  Social Connections:   . Frequency of Communication with Friends and Family: Not on file  . Frequency of Social Gatherings with Friends and Family: Not on file  . Attends Religious Services: Not on file  . Active Member of Clubs or Organizations: Not on file  . Attends Archivist Meetings: Not on file  . Marital Status: Not on file    Family History  Problem Relation Age of Onset  . Hypertension Mother   . Heart disease Mother   . Diabetes Mother   . Hypertension Father   . Anuerysm Father        brain  . Diabetes Maternal Grandmother   . Anuerysm Sister        brain       Review  of Systems  Constitutional: Negative for fever, malaise/fatigue and weight loss.  HENT: Negative for congestion and sore throat.   Eyes:       Negative for visual changes  Respiratory: Negative for cough and shortness of breath.   Cardiovascular: Negative for chest pain, palpitations and leg swelling.  Gastrointestinal: Negative for blood in stool, constipation, diarrhea and heartburn.  Genitourinary: Negative for dysuria, frequency and urgency.  Musculoskeletal: Negative for falls, joint pain and myalgias.  Skin: Negative for rash.  Neurological: Negative  for dizziness, sensory change and headaches.  Endo/Heme/Allergies: Does not bruise/bleed easily.  Psychiatric/Behavioral: Negative for depression, substance abuse and suicidal ideas. The patient is not nervous/anxious.    Objective:   Vitals:   03/07/20 0901  BP: 126/80  Pulse: (!) 59  Temp: 97.8 F (36.6 C)  SpO2: 99%   Body mass index is 30.15 kg/m.  Physical Examination:  Physical Exam Vitals and nursing note reviewed. Exam conducted with a chaperone present.  Constitutional:      General: She is not in acute distress.    Appearance: She is well-developed.  HENT:     Right Ear: Tympanic membrane, ear canal and external ear normal.     Left Ear: Tympanic membrane, ear canal and external ear normal.  Eyes:     Extraocular Movements: Extraocular movements intact.     Conjunctiva/sclera: Conjunctivae normal.  Cardiovascular:     Rate and Rhythm: Normal rate and regular rhythm.     Pulses: Normal pulses.     Heart sounds: Normal heart sounds.  Pulmonary:     Effort: Pulmonary effort is normal. No respiratory distress.     Breath sounds: Normal breath sounds.  Chest:     Chest wall: No tenderness.     Breasts:        Right: Normal.        Left: Normal.  Abdominal:     General: Bowel sounds are normal.     Palpations: Abdomen is soft.     Hernia: There is no hernia in the left inguinal area or right inguinal  area.  Genitourinary:    General: Normal vulva.     Labia:        Right: No rash or tenderness.        Left: No rash or tenderness.      Vagina: Normal. No vaginal discharge.     Cervix: Normal.     Uterus: Normal.      Adnexa: Right adnexa normal and left adnexa normal.     Rectum: External hemorrhoid present.  Musculoskeletal:        General: Normal range of motion.     Cervical back: Normal range of motion and neck supple.     Right lower leg: No edema.     Left lower leg: No edema.  Lymphadenopathy:     Cervical: No cervical adenopathy.     Upper Body:     Right upper body: No supraclavicular, axillary or pectoral adenopathy.     Left upper body: No supraclavicular, axillary or pectoral adenopathy.     Lower Body: No right inguinal adenopathy. No left inguinal adenopathy.  Skin:    General: Skin is warm and dry.  Neurological:     Mental Status: She is alert and oriented to person, place, and time.     Deep Tendon Reflexes: Reflexes are normal and symmetric.  Psychiatric:        Mood and Affect: Mood normal.        Behavior: Behavior normal.        Thought Content: Thought content normal.    ASSESSMENT and PLAN: This visit occurred during the SARS-CoV-2 public health emergency.  Safety protocols were in place, including screening questions prior to the visit, additional usage of staff PPE, and extensive cleaning of exam room while observing appropriate contact time as indicated for disinfecting solutions.   Birdena was seen today for annual exam.  Diagnoses and all orders for this visit:  Encounter for preventative  adult health care exam with abnormal findings -     Lipid panel -     Comprehensive metabolic panel -     Cytology - PAP( [place])  Essential hypertension  Hyperglycemia -     Hemoglobin A1c  Iron deficiency anemia due to chronic blood loss -     CBC w/Diff -     Iron, TIBC and Ferritin Panel -     Fecal occult blood,  imunochemical([facility]); Standing -     Ferrous Fumarate-Folic Acid 354-6 MG TABS; Take 1 tablet by mouth daily with breakfast. -     Iron, TIBC and Ferritin Panel; Future  Encounter for Papanicolaou smear for cervical cancer screening -     Cytology - PAP( [place])  Encounter for lipid screening for cardiovascular disease -     Lipid panel  Bleeding external hemorrhoids -     phenylephrine-shark liver oil-mineral oil-petrolatum (PREPARATION H) 0.25-14-74.9 % rectal ointment; Place 1 application rectally 2 (two) times daily as needed for hemorrhoids.      Problem List Items Addressed This Visit      Cardiovascular and Mediastinum   Essential hypertension    Improved BP with amlodipine, Maxzide and diovan. BP Readings from Last 3 Encounters:  03/07/20 126/80  09/05/19 138/82  08/04/19 (!) 152/70   Maintain current medications        Other   Encounter for Papanicolaou smear for cervical cancer screening   Relevant Orders   Cytology - PAP( [place])   Hyperglycemia   Relevant Orders   Hemoglobin A1c (Completed)   Iron deficiency anemia due to chronic blood loss    Cbc and iron panel indicates severe iron deficient anemia: start ferrous daily, rx sent. Return to lab for ifob kit (check stool for blood). Need to repeat iron panel in 67month Consider iron transfusion if no improvement Ref to GI if positive iFOB.  CBC    Component Value Date/Time   WBC 4.7 03/07/2020 0945   RBC 3.47 (L) 03/07/2020 0945   HGB 9.1 (L) 03/07/2020 0945   HCT 27.8 (L) 03/07/2020 0945   PLT 361.0 03/07/2020 0945   MCV 80.2 03/07/2020 0945   MCH 28.2 05/18/2011 2327   MCHC 32.7 03/07/2020 0945   RDW 17.2 (H) 03/07/2020 0945   LYMPHSABS 1.6 03/07/2020 0945   MONOABS 0.5 03/07/2020 0945   EOSABS 0.2 03/07/2020 0945   BASOSABS 0.1 03/07/2020 0945   Iron/TIBC/Ferritin/ %Sat    Component Value Date/Time   IRON 10 (L) 03/07/2020 0945   TIBC 384 03/07/2020 0945   FERRITIN 6  (L) 03/07/2020 0945   IRONPCTSAT 3 (L) 03/07/2020 0945        Relevant Medications   Ferrous Fumarate-Folic Acid 3568-1MG TABS   Other Relevant Orders   CBC w/Diff (Completed)   Iron, TIBC and Ferritin Panel (Completed)   Fecal occult blood, imunochemical([facility])   Iron, TIBC and Ferritin Panel    Other Visit Diagnoses    Encounter for preventative adult health care exam with abnormal findings    -  Primary   Relevant Orders   Lipid panel (Completed)   Comprehensive metabolic panel (Completed)   Cytology - PAP( [place])   Encounter for lipid screening for cardiovascular disease       Relevant Orders   Lipid panel (Completed)   Bleeding external hemorrhoids       Relevant Medications   phenylephrine-shark liver oil-mineral oil-petrolatum (PREPARATION H) 0.25-14-74.9 % rectal ointment  Follow up: Return in about 6 months (around 09/04/2020) for HTN.  Wilfred Lacy, NP

## 2020-03-07 NOTE — Assessment & Plan Note (Signed)
Improved BP with amlodipine, Maxzide and diovan. BP Readings from Last 3 Encounters:  03/07/20 126/80  09/05/19 138/82  08/04/19 (!) 152/70   Maintain current medications

## 2020-03-07 NOTE — Patient Instructions (Signed)
Go to lab for blood draw Maintain current medications.   Health Maintenance, Female Adopting a healthy lifestyle and getting preventive care are important in promoting health and wellness. Ask your health care provider about:  The right schedule for you to have regular tests and exams.  Things you can do on your own to prevent diseases and keep yourself healthy. What should I know about diet, weight, and exercise? Eat a healthy diet   Eat a diet that includes plenty of vegetables, fruits, low-fat dairy products, and lean protein.  Do not eat a lot of foods that are high in solid fats, added sugars, or sodium. Maintain a healthy weight Body mass index (BMI) is used to identify weight problems. It estimates body fat based on height and weight. Your health care provider can help determine your BMI and help you achieve or maintain a healthy weight. Get regular exercise Get regular exercise. This is one of the most important things you can do for your health. Most adults should:  Exercise for at least 150 minutes each week. The exercise should increase your heart rate and make you sweat (moderate-intensity exercise).  Do strengthening exercises at least twice a week. This is in addition to the moderate-intensity exercise.  Spend less time sitting. Even light physical activity can be beneficial. Watch cholesterol and blood lipids Have your blood tested for lipids and cholesterol at 48 years of age, then have this test every 5 years. Have your cholesterol levels checked more often if:  Your lipid or cholesterol levels are high.  You are older than 48 years of age.  You are at high risk for heart disease. What should I know about cancer screening? Depending on your health history and family history, you may need to have cancer screening at various ages. This may include screening for:  Breast cancer.  Cervical cancer.  Colorectal cancer.  Skin cancer.  Lung cancer. What should  I know about heart disease, diabetes, and high blood pressure? Blood pressure and heart disease  High blood pressure causes heart disease and increases the risk of stroke. This is more likely to develop in people who have high blood pressure readings, are of African descent, or are overweight.  Have your blood pressure checked: ? Every 3-5 years if you are 70-41 years of age. ? Every year if you are 50 years old or older. Diabetes Have regular diabetes screenings. This checks your fasting blood sugar level. Have the screening done:  Once every three years after age 5 if you are at a normal weight and have a low risk for diabetes.  More often and at a younger age if you are overweight or have a high risk for diabetes. What should I know about preventing infection? Hepatitis B If you have a higher risk for hepatitis B, you should be screened for this virus. Talk with your health care provider to find out if you are at risk for hepatitis B infection. Hepatitis C Testing is recommended for:  Everyone born from [birth year] through 1964.  Anyone with known risk factors for hepatitis C. Sexually transmitted infections (STIs)  Get screened for STIs, including gonorrhea and chlamydia, if: ? You are sexually active and are younger than 48 years of age. ? You are older than 47 years of age and your health care provider tells you that you are at risk for this type of infection. ? Your sexual activity has changed since you were last screened, and you are at increased  risk for chlamydia or gonorrhea. Ask your health care provider if you are at risk.  Ask your health care provider about whether you are at high risk for HIV. Your health care provider may recommend a prescription medicine to help prevent HIV infection. If you choose to take medicine to prevent HIV, you should first get tested for HIV. You should then be tested every 3 months for as long as you are taking the medicine. Pregnancy  If you are  about to stop having your period (premenopausal) and you may become pregnant, seek counseling before you get pregnant.  Take 400 to 800 micrograms (mcg) of folic acid every day if you become pregnant.  Ask for birth control (contraception) if you want to prevent pregnancy. Osteoporosis and menopause Osteoporosis is a disease in which the bones lose minerals and strength with aging. This can result in bone fractures. If you are 9 years old or older, or if you are at risk for osteoporosis and fractures, ask your health care provider if you should:  Be screened for bone loss.  Take a calcium or vitamin D supplement to lower your risk of fractures.  Be given hormone replacement therapy (HRT) to treat symptoms of menopause. Follow these instructions at home: Lifestyle  Do not use any products that contain nicotine or tobacco, such as cigarettes, e-cigarettes, and chewing tobacco. If you need help quitting, ask your health care provider.  Do not use street drugs.  Do not share needles.  Ask your health care provider for help if you need support or information about quitting drugs. Alcohol use  Do not drink alcohol if: ? Your health care provider tells you not to drink. ? You are pregnant, may be pregnant, or are planning to become pregnant.  If you drink alcohol: ? Limit how much you use to 0-1 drink a day. ? Limit intake if you are breastfeeding.  Be aware of how much alcohol is in your drink. In the [place] one drink equals one 12 oz bottle of beer (355 mL), one 5 oz glass of wine (148 mL), or one 1 oz glass of hard liquor (44 mL). General instructions  Schedule regular health, dental, and eye exams.  Stay current with your vaccines.  Tell your health care provider if: ? You often feel depressed. ? You have ever been abused or do not feel safe at home. Summary  Adopting a healthy lifestyle and getting preventive care are important in promoting health and wellness.  Follow  your health care provider's instructions about healthy diet, exercising, and getting tested or screened for diseases.  Follow your health care provider's instructions on monitoring your cholesterol and blood pressure. This information is not intended to replace advice given to you by your health care provider. Make sure you discuss any questions you have with your health care provider. Document Revised: 04/06/2018 Document Reviewed: 04/06/2018 Elsevier Patient Education  2020 [employer].

## 2020-03-08 ENCOUNTER — Telehealth: Payer: Self-pay | Admitting: Nurse Practitioner

## 2020-03-08 DIAGNOSIS — N920 Excessive and frequent menstruation with regular cycle: Secondary | ICD-10-CM

## 2020-03-08 DIAGNOSIS — D500 Iron deficiency anemia secondary to blood loss (chronic): Secondary | ICD-10-CM

## 2020-03-08 LAB — IRON,TIBC AND FERRITIN PANEL
%SAT: 3 % (calc) — ABNORMAL LOW (ref 16–45)
Ferritin: 6 ng/mL — ABNORMAL LOW (ref 16–232)
Iron: 10 ug/dL — ABNORMAL LOW (ref 40–190)
TIBC: 384 mcg/dL (calc) (ref 250–450)

## 2020-03-08 LAB — CYTOLOGY - PAP
Adequacy: ABSENT
Comment: NEGATIVE
Diagnosis: NEGATIVE
High risk HPV: NEGATIVE

## 2020-03-08 MED ORDER — FERROUS FUMARATE-FOLIC ACID 324-1 MG PO TABS: 1.0000 | ORAL_TABLET | Freq: Every day | ORAL | 1 refills | Status: DC

## 2020-03-08 MED ORDER — PREPARATION H 0.25-14-74.9 % RE OINT: 1.0000 "application " | TOPICAL_OINTMENT | Freq: Two times a day (BID) | RECTAL | Status: DC | PRN

## 2020-03-08 NOTE — Telephone Encounter (Signed)
-----   Message from Priscella Mann, [place] sent at 03/08/2020  3:33 PM EST ----- Patient notified and verbalized understanding. Pt states there was no clots during her period but she did have a very heavy flow.

## 2020-03-08 NOTE — Assessment & Plan Note (Addendum)
Cbc and iron panel indicates severe iron deficient anemia: start ferrous daily, rx sent. Return to lab for ifob kit (check stool for blood). Need to repeat iron panel in 5month Consider iron transfusion if no improvement Ref to GI if positive iFOB.  CBC    Component Value Date/Time   WBC 4.7 03/07/2020 0945   RBC 3.47 (L) 03/07/2020 0945   HGB 9.1 (L) 03/07/2020 0945   HCT 27.8 (L) 03/07/2020 0945   PLT 361.0 03/07/2020 0945   MCV 80.2 03/07/2020 0945   MCH 28.2 05/18/2011 2327   MCHC 32.7 03/07/2020 0945   RDW 17.2 (H) 03/07/2020 0945   LYMPHSABS 1.6 03/07/2020 0945   MONOABS 0.5 03/07/2020 0945   EOSABS 0.2 03/07/2020 0945   BASOSABS 0.1 03/07/2020 0945   Iron/TIBC/Ferritin/ %Sat    Component Value Date/Time   IRON 10 (L) 03/07/2020 0945   TIBC 384 03/07/2020 0945   FERRITIN 6 (L) 03/07/2020 0945   IRONPCTSAT 3 (L) 03/07/2020 0945

## 2020-03-12 ENCOUNTER — Other Ambulatory Visit: Payer: Self-pay

## 2020-03-12 ENCOUNTER — Other Ambulatory Visit (INDEPENDENT_AMBULATORY_CARE_PROVIDER_SITE_OTHER): Payer: 59

## 2020-03-12 DIAGNOSIS — R195 Other fecal abnormalities: Secondary | ICD-10-CM

## 2020-03-12 DIAGNOSIS — D500 Iron deficiency anemia secondary to blood loss (chronic): Secondary | ICD-10-CM | POA: Diagnosis not present

## 2020-03-14 LAB — FECAL OCCULT BLOOD, IMMUNOCHEMICAL: Fecal Occult Bld: POSITIVE — AB

## 2020-03-14 NOTE — Addendum Note (Signed)
Addended by: Michaela Corner on: 03/14/2020 09:45 AM   Modules accepted: Orders

## 2020-03-28 ENCOUNTER — Other Ambulatory Visit: Payer: Self-pay | Admitting: Nurse Practitioner

## 2020-03-28 DIAGNOSIS — I10 Essential (primary) hypertension: Secondary | ICD-10-CM

## 2020-03-28 NOTE — Telephone Encounter (Signed)
Last OV 03/07/20 Last fill for both meds 02/28/19  #90/3

## 2020-04-02 ENCOUNTER — Ambulatory Visit: Payer: 59 | Admitting: Gastroenterology

## 2020-04-02 ENCOUNTER — Encounter: Payer: Self-pay | Admitting: Gastroenterology

## 2020-04-02 VITALS — BP 120/80 | HR 63 | Ht 62.0 in | Wt 183.0 lb

## 2020-04-02 DIAGNOSIS — D500 Iron deficiency anemia secondary to blood loss (chronic): Secondary | ICD-10-CM | POA: Diagnosis not present

## 2020-04-02 DIAGNOSIS — K648 Other hemorrhoids: Secondary | ICD-10-CM

## 2020-04-02 DIAGNOSIS — K5909 Other constipation: Secondary | ICD-10-CM

## 2020-04-02 NOTE — Patient Instructions (Signed)
If you are age 48 or older, your body mass index should be between 23-30. Your Body mass index is 33.47 kg/m. If this is out of the aforementioned range listed, please consider follow up with your Primary Care Provider.  If you are age 10 or younger, your body mass index should be between 19-25. Your Body mass index is 33.47 kg/m. If this is out of the aformentioned range listed, please consider follow up with your Primary Care Provider.    Hemorrhoids Hemorrhoids are swollen veins in and around the rectum or anus. There are two types of hemorrhoids:  Internal hemorrhoids. These occur in the veins that are just inside the rectum. They may poke through to the outside and become irritated and painful.  External hemorrhoids. These occur in the veins that are outside the anus and can be felt as a painful swelling or hard lump near the anus. Most hemorrhoids do not cause serious problems, and they can be managed with home treatments such as diet and lifestyle changes. If home treatments do not help the symptoms, procedures can be done to shrink or remove the hemorrhoids. What are the causes? This condition is caused by increased pressure in the anal area. This pressure may result from various things, including:  Constipation.  Straining to have a bowel movement.  Diarrhea.  Pregnancy.  Obesity.  Sitting for long periods of time.  Heavy lifting or other activity that causes you to strain.  Anal sex.  Riding a bike for a long period of time. What are the signs or symptoms? Symptoms of this condition include:  Pain.  Anal itching or irritation.  Rectal bleeding.  Leakage of stool (feces).  Anal swelling.  One or more lumps around the anus. How is this diagnosed? This condition can often be diagnosed through a visual exam. Other exams or tests may also be done, such as:  An exam that involves feeling the rectal area with a gloved hand (digital rectal exam).  An exam of the  anal canal that is done using a small tube (anoscope).  A blood test, if you have lost a significant amount of blood.  A test to look inside the colon using a flexible tube with a camera on the end (sigmoidoscopy or colonoscopy). How is this treated? This condition can usually be treated at home. However, various procedures may be done if dietary changes, lifestyle changes, and other home treatments do not help your symptoms. These procedures can help make the hemorrhoids smaller or remove them completely. Some of these procedures involve surgery, and others do not. Common procedures include:  Rubber band ligation. Rubber bands are placed at the base of the hemorrhoids to cut off their blood supply.  Sclerotherapy. Medicine is injected into the hemorrhoids to shrink them.  Infrared coagulation. A type of light energy is used to get rid of the hemorrhoids.  Hemorrhoidectomy surgery. The hemorrhoids are surgically removed, and the veins that supply them are tied off.  Stapled hemorrhoidopexy surgery. The surgeon staples the base of the hemorrhoid to the rectal wall. Follow these instructions at home: Eating and drinking   Eat foods that have a lot of fiber in them, such as whole grains, beans, nuts, fruits, and vegetables.  Ask your health care provider about taking products that have added fiber (fiber supplements).  Reduce the amount of fat in your diet. You can do this by eating low-fat dairy products, eating less red meat, and avoiding processed foods.  Drink enough  fluid to keep your urine pale yellow. Managing pain and swelling   Take warm sitz baths for 20 minutes, 3-4 times a day to ease pain and discomfort. You may do this in a bathtub or using a portable sitz bath that fits over the toilet.  If directed, apply ice to the affected area. Using ice packs between sitz baths may be helpful. ? Put ice in a plastic bag. ? Place a towel between your skin and the bag. ? Leave the  ice on for 20 minutes, 2-3 times a day. General instructions  Take over-the-counter and prescription medicines only as told by your health care provider.  Use medicated creams or suppositories as told.  Get regular exercise. Ask your health care provider how much and what kind of exercise is best for you. In general, you should do moderate exercise for at least 30 minutes on most days of the week (150 minutes each week). This can include activities such as walking, biking, or yoga.  Go to the bathroom when you have the urge to have a bowel movement. Do not wait.  Avoid straining to have bowel movements.  Keep the anal area dry and clean. Use wet toilet paper or moist towelettes after a bowel movement.  Do not sit on the toilet for long periods of time. This increases blood pooling and pain.  Keep all follow-up visits as told by your health care provider. This is important. Contact a health care provider if you have:  Increasing pain and swelling that are not controlled by treatment or medicine.  Difficulty having a bowel movement, or you are unable to have a bowel movement.  Pain or inflammation outside the area of the hemorrhoids. Get help right away if you have:  Uncontrolled bleeding from your rectum. Summary  Hemorrhoids are swollen veins in and around the rectum or anus.  Most hemorrhoids can be managed with home treatments such as diet and lifestyle changes.  Taking warm sitz baths can help ease pain and discomfort.  In severe cases, procedures or surgery can be done to shrink or remove the hemorrhoids. This information is not intended to replace advice given to you by your health care provider. Make sure you discuss any questions you have with your health care provider. Document Revised: 09/09/2018 Document Reviewed: 09/02/2017 Elsevier Patient Education  [employer].   It was a pleasure to see you today!  Dr. Myrtie Neither

## 2020-04-02 NOTE — Progress Notes (Signed)
[facility] Consult Note:  History: Jane Hampton 04/02/2020  Referring provider: Anne Ng, NP  Reason for consult/chief complaint: Hemorrhoids (pt reports brb, states her hemorrhoids have "flared up"; denies pain or itching)   Subjective  HPI: Seen in 2017 for constipation and rectal bleeding.  Colonoscopy performed, patient had anal fissure treated with nitroglycerin ointment and then one session of hemorrhoidal banding (RA column) 02/06/2016.  Plans were for a trial of low-dose Linzess and follow-up for additional banding, though no visits since then.  Jane Hampton says she saw the band fall off a day or 2 after the procedure and she did not feel that it worked.  She has had intermittent rectal bleeding which seems to coincide with episodic constipation.  It has been more of an issue in the last 4 to 6 weeks and she also has menorrhagia, both of which she feels contribute to the anemia.  Primary care recently prescribed prescription strength iron and she still takes as needed Linzess to help relieve constipation.  No dyschezia.  Denies abdominal pain.  ROS:  Review of Systems  Constitutional: Negative for appetite change and unexpected weight change.  HENT: Negative for mouth sores and voice change.   Eyes: Negative for pain and redness.  Respiratory: Negative for cough and shortness of breath.   Cardiovascular: Negative for chest pain and palpitations.  Genitourinary: Negative for dysuria and hematuria.       Menorrhagia for years, she feels as if it is been getting worse as she gets older.  Musculoskeletal: Negative for arthralgias and myalgias.  Skin: Negative for pallor and rash.  Neurological: Negative for weakness and headaches.  Hematological: Negative for adenopathy.     Past Medical History: Past Medical History:  Diagnosis Date  . Hematochezia 09/06/2015  . Hypertension   . Migraines      Past Surgical History: Past  Surgical History:  Procedure Laterality Date  . CESAREAN SECTION    . TONSILLECTOMY    . TUBAL LIGATION       Family History: Family History  Problem Relation Age of Onset  . Hypertension Mother   . Heart disease Mother   . Diabetes Mother   . Hypertension Father   . Anuerysm Father        brain  . Diabetes Maternal Grandmother   . Anuerysm Sister        brain    Social History: Social History   Socioeconomic History  . Marital status: Married    Spouse name: Not on file  . Number of children: 3  . Years of education: Not on file  . Highest education level: Not on file  Occupational History  . Occupation: Education administrator  Tobacco Use  . Smoking status: Former Smoker    Years: 15.00    Types: Cigarettes    Quit date: 10/02/2000    Years since quitting: 19.5  . Smokeless tobacco: Never Used  Vaping Use  . Vaping Use: Never used  Substance and Sexual Activity  . Alcohol use: No    Alcohol/week: 0.0 standard drinks  . Drug use: No  . Sexual activity: Yes    Birth control/protection: Surgical  Other Topics Concern  . Not on file  Social History Narrative  . Not on file   Social Determinants of Health   Financial Resource Strain:   . Difficulty of Paying Living Expenses: Not on file  Food Insecurity:   . Worried About Programme researcher, broadcasting/film/video in the  Last Year: Not on file  . Ran Out of Food in the Last Year: Not on file  Transportation Needs:   . Lack of Transportation (Medical): Not on file  . Lack of Transportation (Non-Medical): Not on file  Physical Activity:   . Days of Exercise per Week: Not on file  . Minutes of Exercise per Session: Not on file  Stress:   . Feeling of Stress : Not on file  Social Connections:   . Frequency of Communication with Friends and Family: Not on file  . Frequency of Social Gatherings with Friends and Family: Not on file  . Attends Religious Services: Not on file  . Active Member of Clubs or Organizations: Not on  file  . Attends Banker Meetings: Not on file  . Marital Status: Not on file    Allergies: Allergies  Allergen Reactions  . Lisinopril Swelling    Severe upper lip swelling    Outpatient Meds: Current Outpatient Medications  Medication Sig Dispense Refill  . amLODipine (NORVASC) 10 MG tablet TAKE 1 TABLET BY MOUTH EVERYDAY AT BEDTIME 90 tablet 3  . Ferrous Fumarate-Folic Acid 324-1 MG TABS Take 1 tablet by mouth daily with breakfast. 90 tablet 1  . ibuprofen (ADVIL,MOTRIN) 800 MG tablet   0  . linaclotide (LINZESS) 145 MCG CAPS capsule Take 1 capsule (145 mcg total) by mouth daily before breakfast. 90 capsule 3  . phenylephrine-shark liver oil-mineral oil-petrolatum (PREPARATION H) 0.25-14-74.9 % rectal ointment Place 1 application rectally 2 (two) times daily as needed for hemorrhoids.    . triamterene-hydrochlorothiazide (MAXZIDE-25) 37.5-25 MG tablet TAKE 1 TABLET BY MOUTH EVERY DAY 90 tablet 0  . valsartan (DIOVAN) 160 MG tablet TAKE 1 TABLET BY MOUTH EVERY DAY 90 tablet 3   No current facility-administered medications for this visit.      ___________________________________________________________________ Objective   Exam:  BP 120/80   Pulse 63   Ht 5\' 2"  (1.575 m)   Wt 183 lb (83 kg)   BMI 33.47 kg/m  Exam chaperoned by , CMA  General: Well-appearing  Eyes: sclera anicteric, no redness  ENT: oral mucosa moist without lesions, no cervical or supraclavicular lymphadenopathy  CV: RRR without murmur, S1/S2, no JVD, no peripheral edema  Resp: clear to auscultation bilaterally, normal RR and effort noted  GI: soft, no tenderness, with active bowel sounds. No guarding or palpable organomegaly noted.  Skin; warm and dry, no rash or jaundice noted  Neuro: awake, alert and oriented x 3. Normal gross motor function and fluent speech Rectal: Redundant external skin folds, no fissure or tenderness or palpable internal lesions.   Anoscopy:  Internal hemorrhoids, primarily RA and RP columns  Labs:  CBC Latest Ref Rng & Units 03/07/2020 02/28/2019 12/24/2017  WBC 4.0 - 10.5 K/uL 4.7 5.7 6.8  Hemoglobin 12.0 - 15.0 g/dL 12/26/2017) 11.2(L) 13.4  Hematocrit 36 - 46 % 27.8(L) 34.1(L) 40.1  Platelets 150 - 400 K/uL 361.0 312.0 313.0   Intermittent microcytic anemia as far back as at least 2016.  Iron/TIBC/Ferritin/ %Sat    Component Value Date/Time   IRON 10 (L) 03/07/2020 0945   TIBC 384 03/07/2020 0945   FERRITIN 6 (L) 03/07/2020 0945   IRONPCTSAT 3 (L) 03/07/2020 0945   Iron levels were normal in August 2019, but low in May 2017   Assessment: Encounter Diagnoses  Name Primary?  . Bleeding internal hemorrhoids Yes  . Chronic constipation   . Iron deficiency anemia due to chronic blood  loss     Her IDA is most likely primarily from menorrhagia with a lesser component of blood loss from internal hemorrhoids. Constipation generally well controlled.  Plan:  I recommended hemorrhoidal banding and she was agreeable but wanted to wait until after the holidays.  Appointment was arranged for January. Continue iron supplement Recommend discussing treatments for menorrhagia with her gynecologist, especially given the severity of anemia that is causing.  Thank you for the courtesy of this consult.  Please call me with any questions or concerns.  Charlie Pitter III  CC: Referring provider noted above

## 2020-04-04 ENCOUNTER — Other Ambulatory Visit: Payer: Self-pay

## 2020-04-05 ENCOUNTER — Other Ambulatory Visit (INDEPENDENT_AMBULATORY_CARE_PROVIDER_SITE_OTHER): Payer: 59

## 2020-04-05 DIAGNOSIS — D500 Iron deficiency anemia secondary to blood loss (chronic): Secondary | ICD-10-CM | POA: Diagnosis not present

## 2020-04-06 LAB — IRON,TIBC AND FERRITIN PANEL
%SAT: 13 % (calc) — ABNORMAL LOW (ref 16–45)
Ferritin: 33 ng/mL (ref 16–232)
Iron: 43 ug/dL (ref 40–190)
TIBC: 319 mcg/dL (calc) (ref 250–450)

## 2020-05-06 ENCOUNTER — Other Ambulatory Visit: Payer: Self-pay | Admitting: Nurse Practitioner

## 2020-05-06 DIAGNOSIS — I10 Essential (primary) hypertension: Secondary | ICD-10-CM

## 2020-05-09 NOTE — Telephone Encounter (Signed)
Last OV 03/07/20 Last fill 02/02/20  #90/0

## 2020-05-22 ENCOUNTER — Encounter: Payer: 59 | Admitting: Gastroenterology

## 2020-07-28 IMAGING — MG DIGITAL DIAGNOSTIC UNILATERAL LEFT MAMMOGRAM WITH TOMO AND CAD
4 series · 4 of 12 positions shown · non-contrast
Comparison: Previous exam(s).

CLINICAL DATA: Screening recall for a possible left breast mass.

EXAM:
DIGITAL DIAGNOSTIC LEFT MAMMOGRAM WITH CAD AND TOMO
ULTRASOUND LEFT BREAST

[L CC synth-2D]
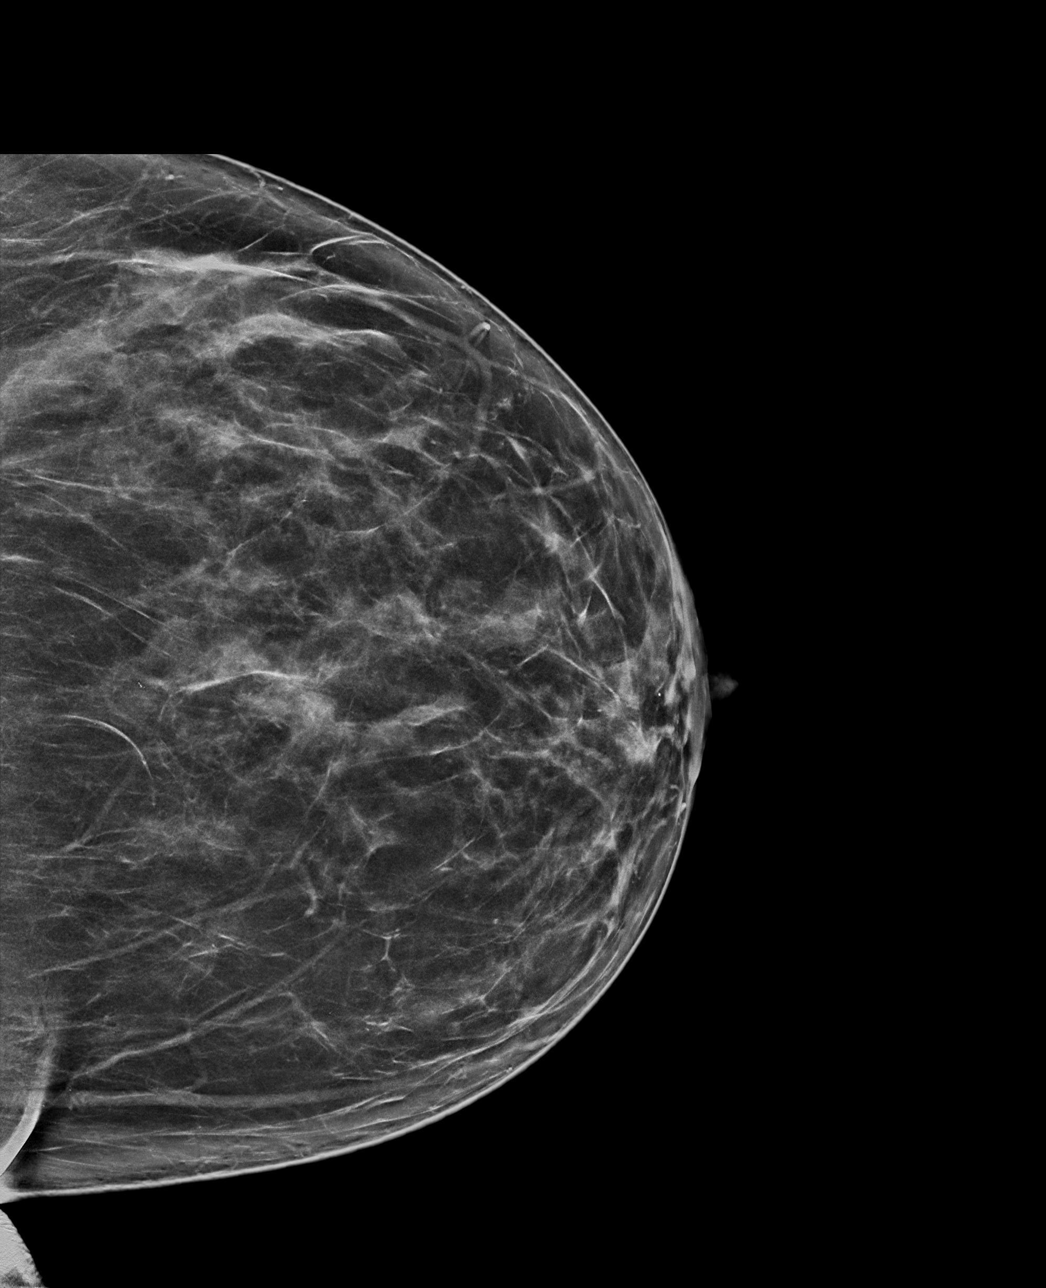

[L MLO synth-2D]
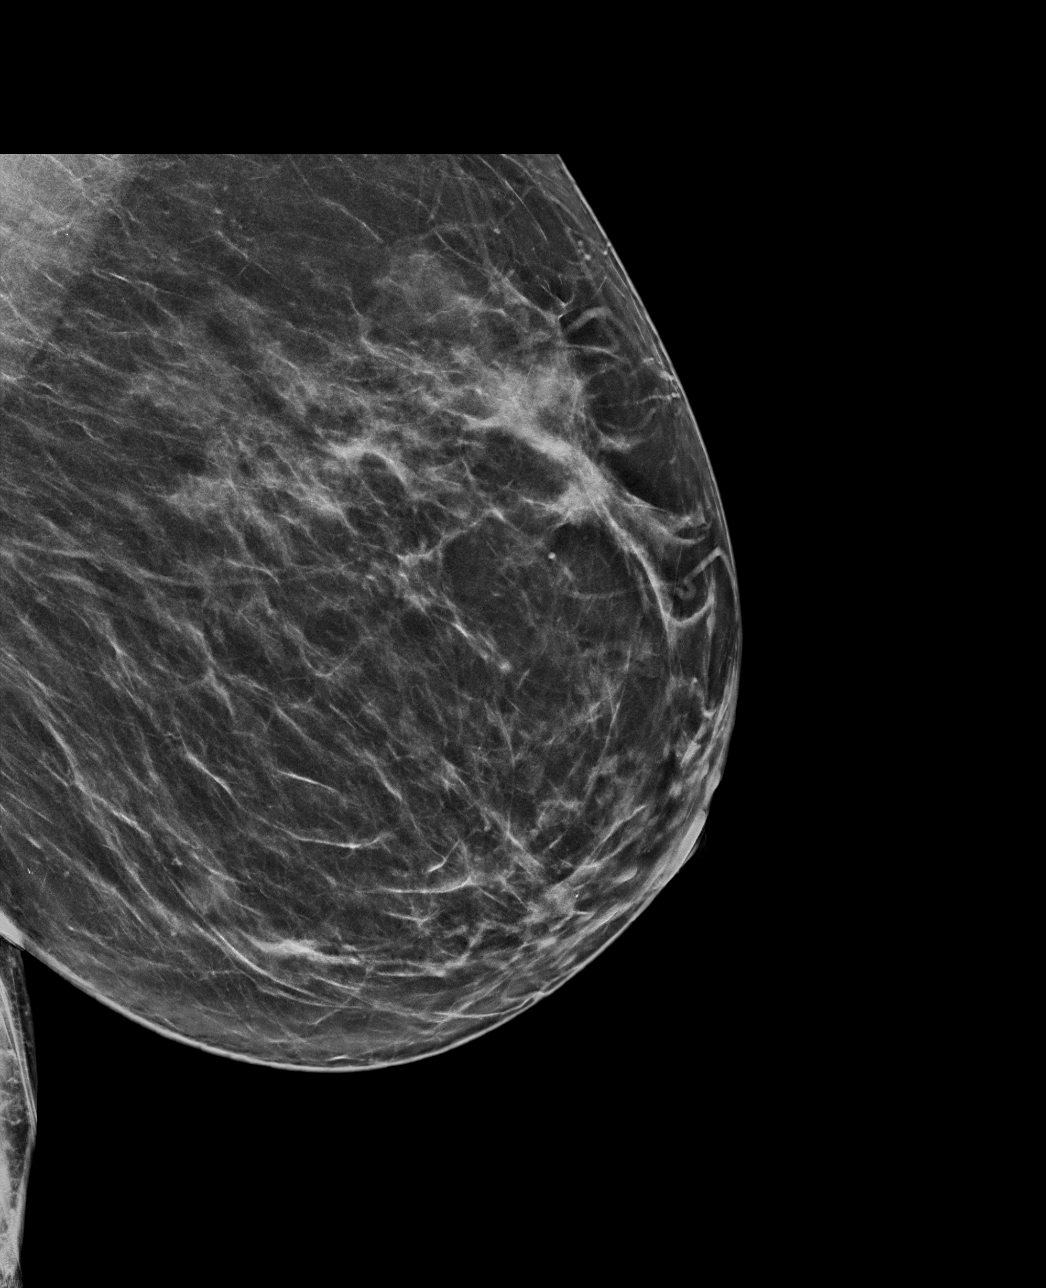

[L MLO tomo · tomo slice 41/81.0]
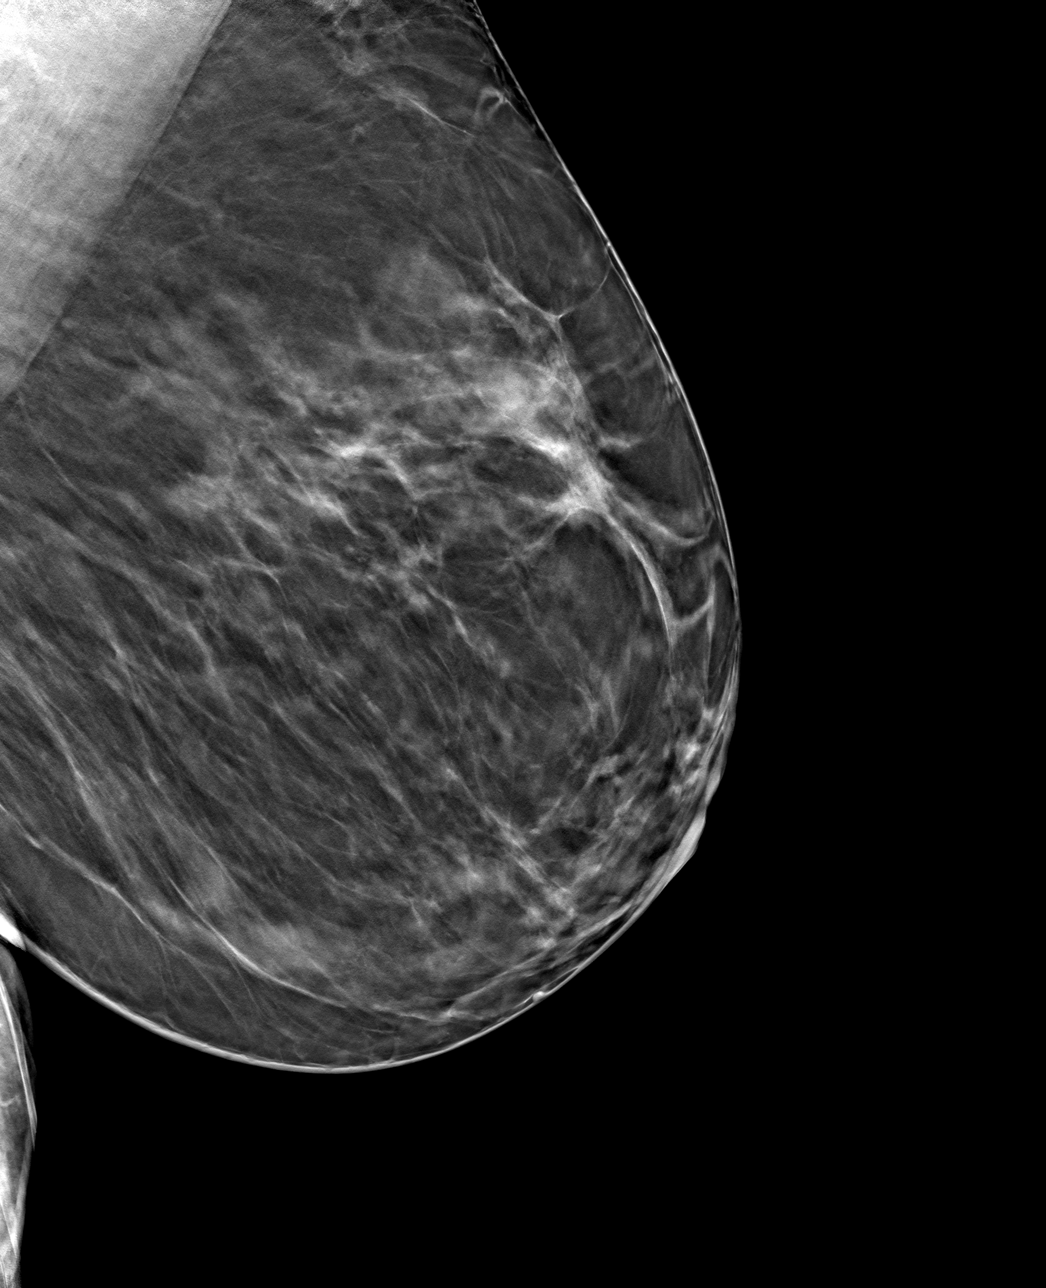

[L CC tomo · tomo slice 39/78.0]
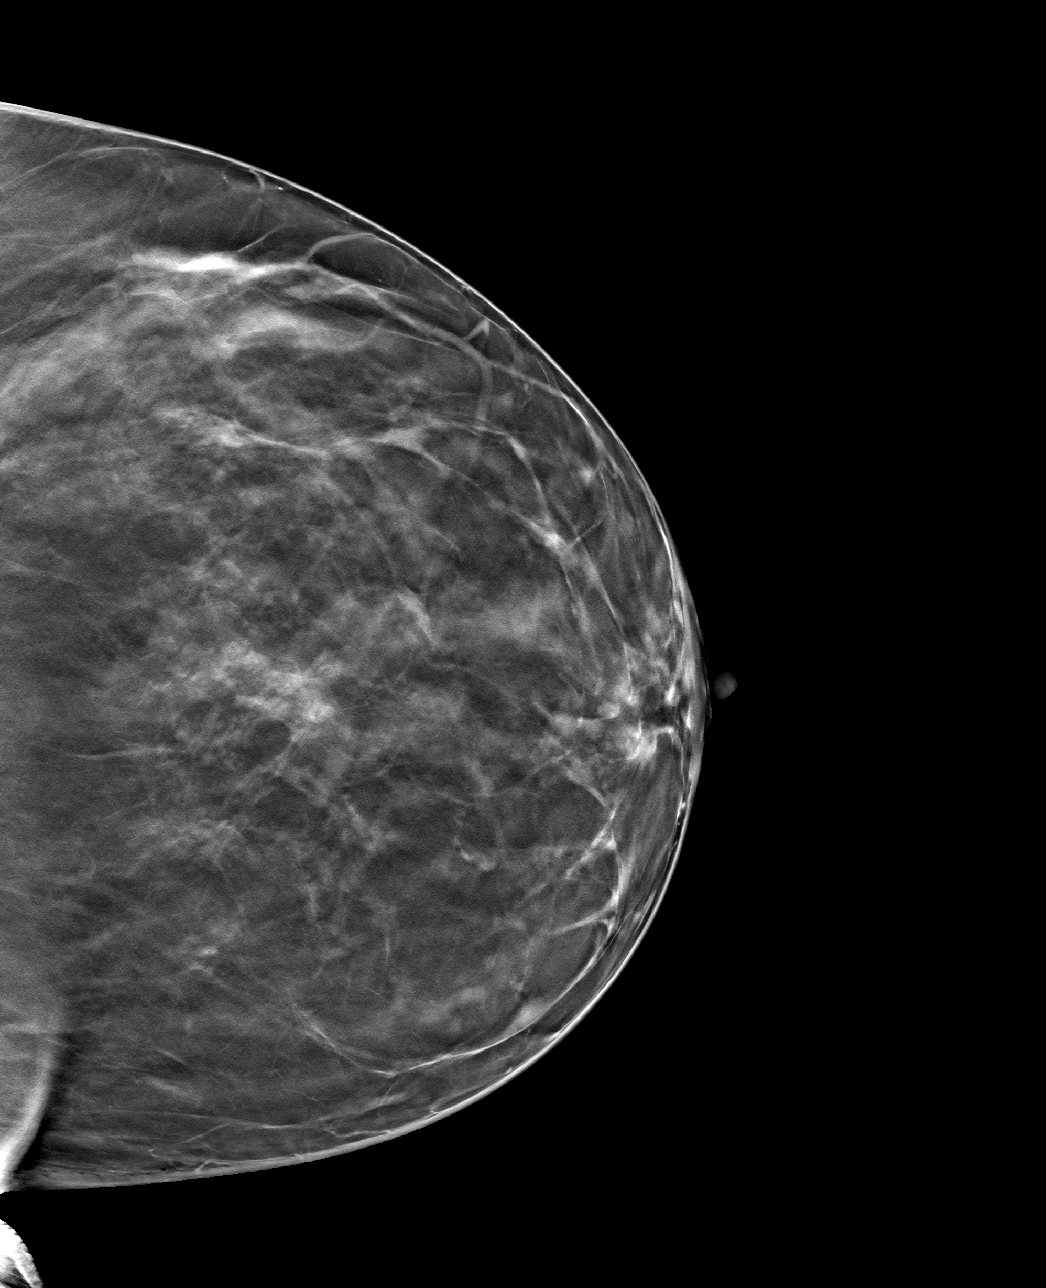

[4 of 12 positions shown; findings below may reference images not displayed]

ACR Breast Density Category b: There are scattered areas of
fibroglandular density.
FINDINGS: The possible mass in the inferior left breast persists the
diagnostic images. It appears as a cluster of round, circumscribed
masses in the inferior left breast on tomosynthesis images. There
are no other masses, no areas of architectural distortion and no
suspicious calcifications.

Mammographic images were processed with CAD.

On physical exam, no mass is palpated along the inferior left
breast.

Targeted ultrasound is performed, showing a cluster of cysts in the
6 o'clock position of the left breast, 4 cm the nipple, measuring a
combined 2.0 x 0.6 x 1.7 cm. This is consistent size, shape and
location to the mammographic finding. No solid masses or suspicious
lesions.
IMPRESSION: 1. Benign left breast cluster of cyst.
2. No evidence of breast malignancy.

RECOMMENDATION:
Screening mammogram in one year.(Code:QX-U-LM2)

I have discussed the findings and recommendations with the patient.
Results were also provided in writing at the conclusion of the
visit. If applicable, a reminder letter will be sent to the patient
regarding the next appointment.

BI-RADS CATEGORY  2: Benign.

## 2020-07-28 IMAGING — US ULTRASOUND LEFT BREAST LIMITED
1 series · 6 of 6 positions shown · non-contrast
Comparison: Previous exam(s).

CLINICAL DATA: Screening recall for a possible left breast mass.

EXAM:
DIGITAL DIAGNOSTIC LEFT MAMMOGRAM WITH CAD AND TOMO
ULTRASOUND LEFT BREAST

[Series 1: ultrasound left breast limited · 0.06mm/px · 6 of 6 slices shown]
[im 1/6]
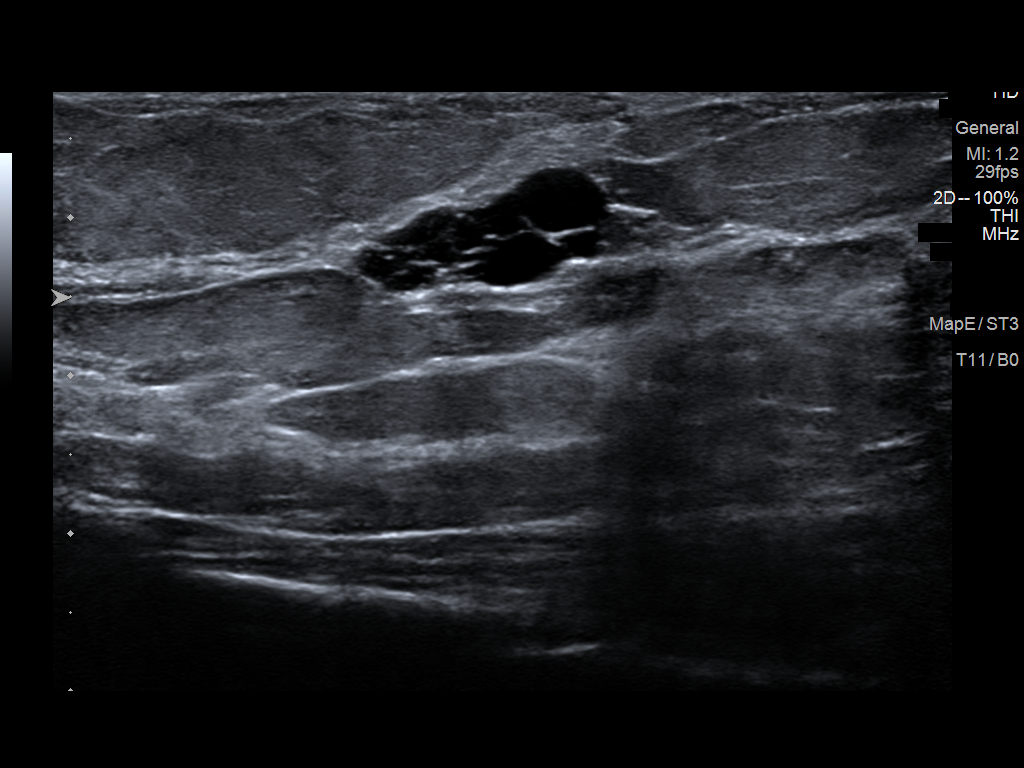
[im 2/6]
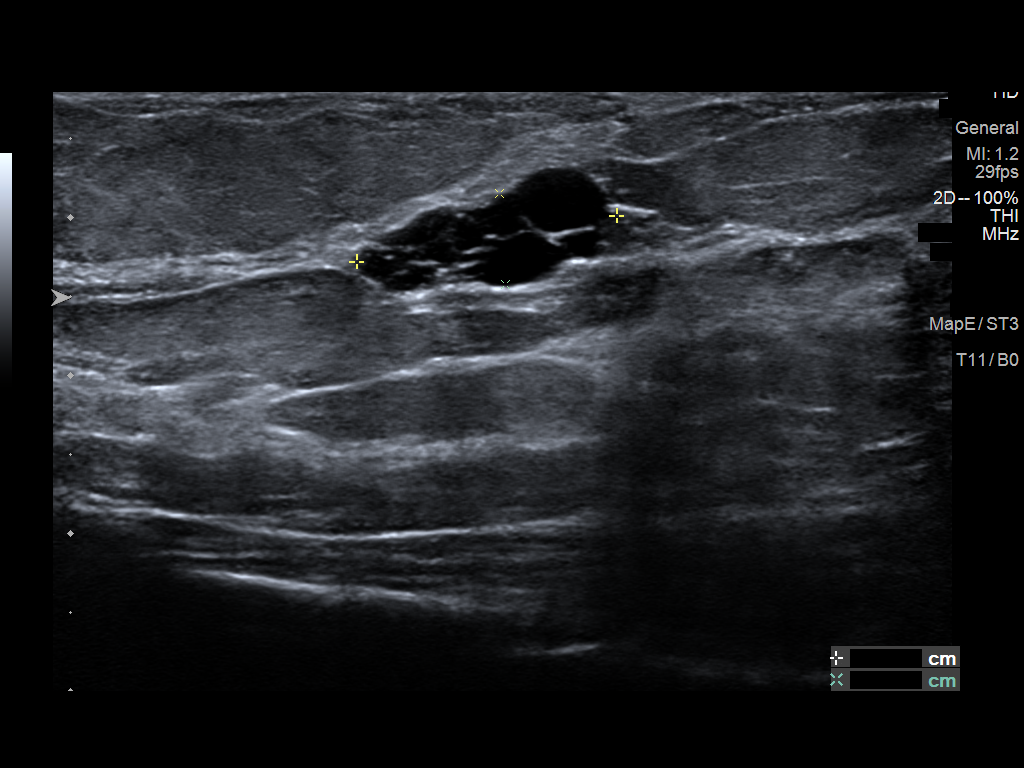
[im 3/6]
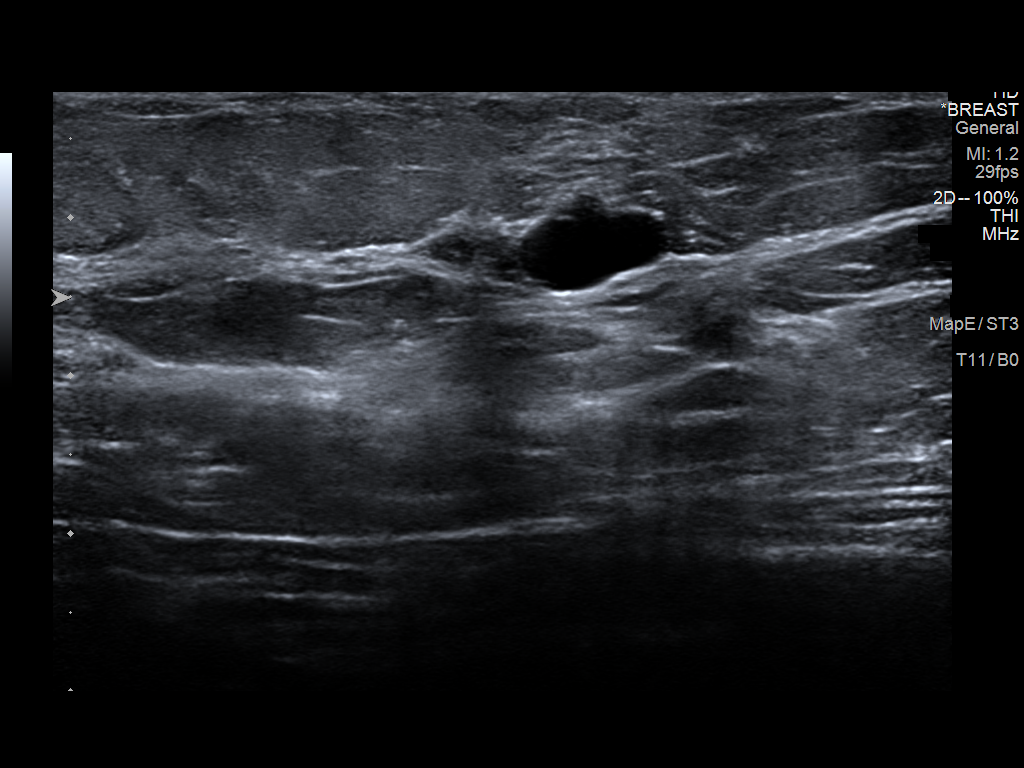
[im 4/6]
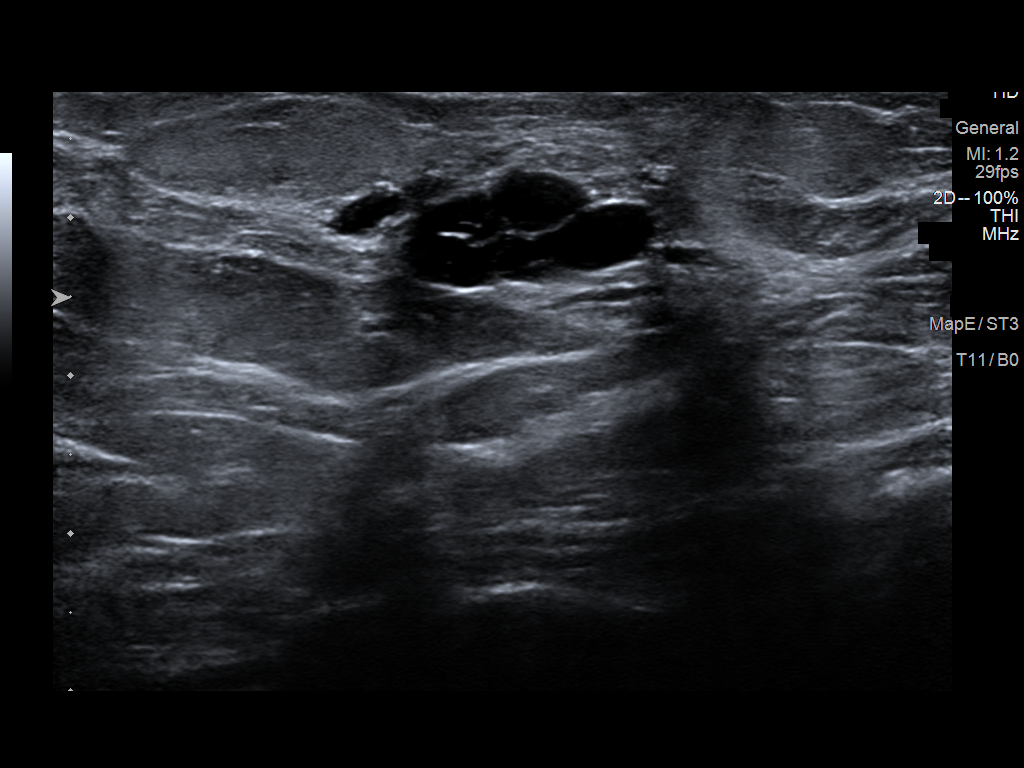
[im 5/6]
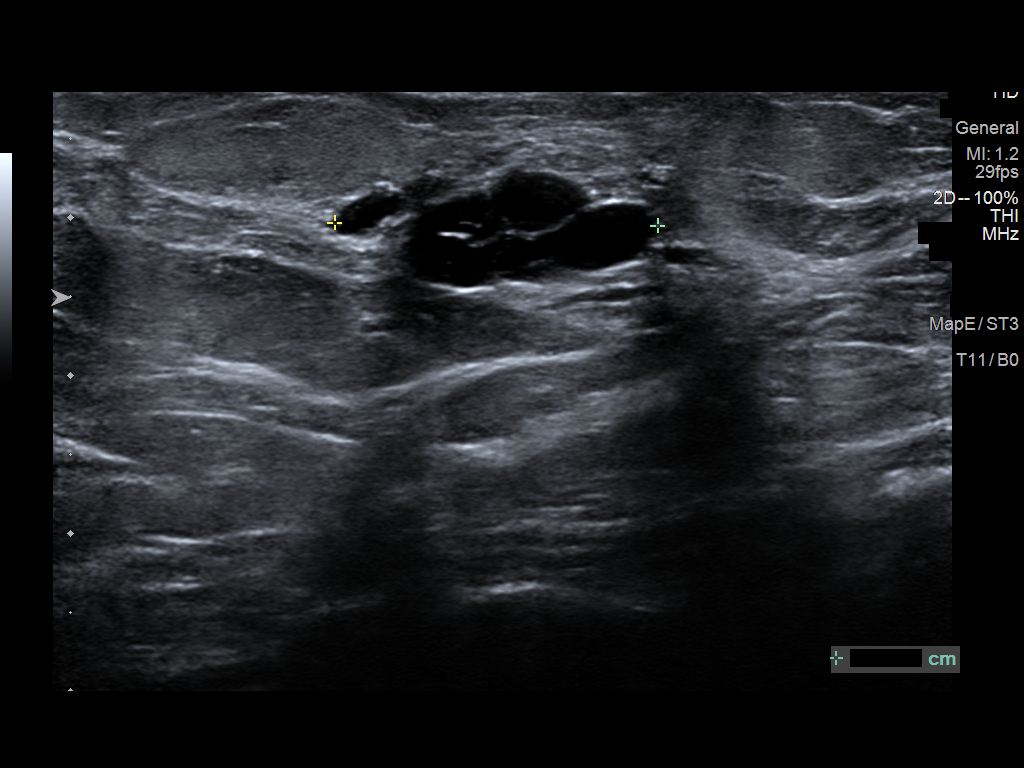
[im 6/6]
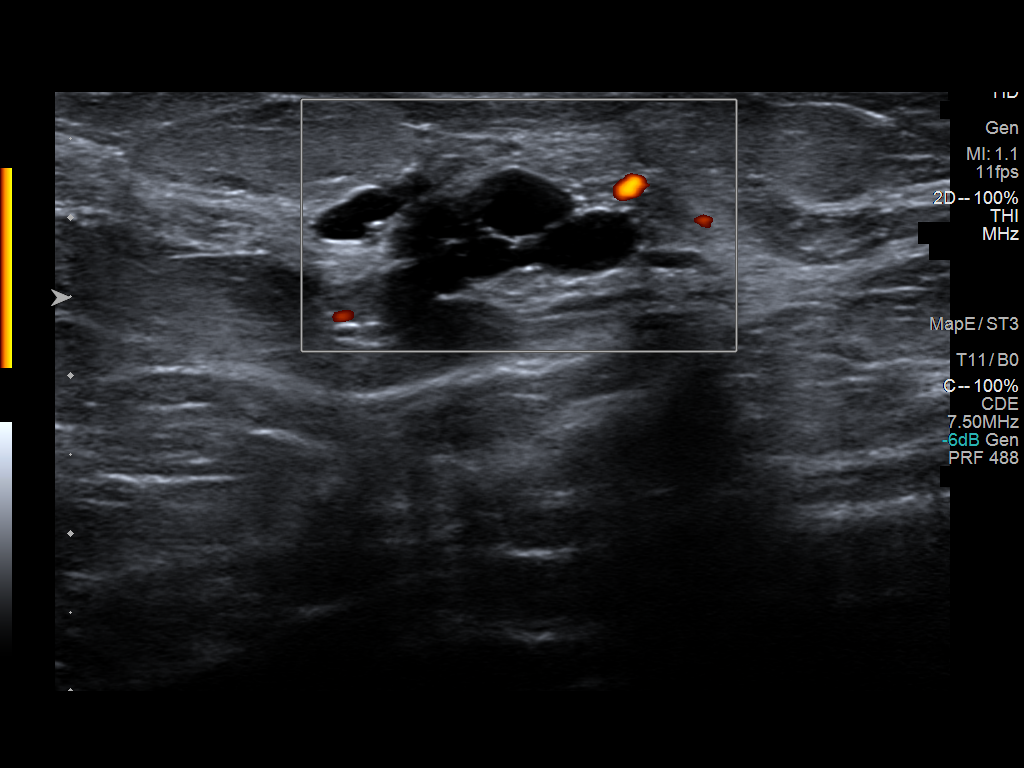

[6 of 6 positions shown; findings below may reference images not displayed]

ACR Breast Density Category b: There are scattered areas of
fibroglandular density.
FINDINGS: The possible mass in the inferior left breast persists the
diagnostic images. It appears as a cluster of round, circumscribed
masses in the inferior left breast on tomosynthesis images. There
are no other masses, no areas of architectural distortion and no
suspicious calcifications.

Mammographic images were processed with CAD.

On physical exam, no mass is palpated along the inferior left
breast.

Targeted ultrasound is performed, showing a cluster of cysts in the
6 o'clock position of the left breast, 4 cm the nipple, measuring a
combined 2.0 x 0.6 x 1.7 cm. This is consistent size, shape and
location to the mammographic finding. No solid masses or suspicious
lesions.
IMPRESSION: 1. Benign left breast cluster of cyst.
2. No evidence of breast malignancy.

RECOMMENDATION:
Screening mammogram in one year.(Code:QX-U-LM2)

I have discussed the findings and recommendations with the patient.
Results were also provided in writing at the conclusion of the
visit. If applicable, a reminder letter will be sent to the patient
regarding the next appointment.

BI-RADS CATEGORY  2: Benign.

## 2020-09-05 ENCOUNTER — Ambulatory Visit: Payer: 59 | Admitting: Nurse Practitioner

## 2020-09-19 ENCOUNTER — Other Ambulatory Visit: Payer: Self-pay | Admitting: Nurse Practitioner

## 2020-09-19 DIAGNOSIS — D500 Iron deficiency anemia secondary to blood loss (chronic): Secondary | ICD-10-CM

## 2020-10-26 ENCOUNTER — Other Ambulatory Visit: Payer: Self-pay | Admitting: Nurse Practitioner

## 2020-10-26 DIAGNOSIS — K5904 Chronic idiopathic constipation: Secondary | ICD-10-CM

## 2020-10-29 ENCOUNTER — Telehealth: Payer: Self-pay

## 2020-11-07 NOTE — Telephone Encounter (Signed)
Pt concerned resolved. refilled medication for Linzess, pt will need to come in for f/u visit for additional refills. Pt verbalized understanding.

## 2020-12-12 ENCOUNTER — Other Ambulatory Visit: Payer: Self-pay | Admitting: Nurse Practitioner

## 2020-12-12 DIAGNOSIS — D500 Iron deficiency anemia secondary to blood loss (chronic): Secondary | ICD-10-CM

## 2020-12-25 ENCOUNTER — Ambulatory Visit: Payer: Medicaid Other | Admitting: Nurse Practitioner

## 2021-01-27 ENCOUNTER — Other Ambulatory Visit: Payer: Self-pay

## 2021-01-27 ENCOUNTER — Encounter: Payer: Self-pay | Admitting: Nurse Practitioner

## 2021-01-27 ENCOUNTER — Ambulatory Visit: Payer: BC Managed Care – PPO | Admitting: Nurse Practitioner

## 2021-01-27 VITALS — BP 102/64 | Temp 97.1°F | Ht 64.0 in | Wt 177.8 lb

## 2021-01-27 DIAGNOSIS — R739 Hyperglycemia, unspecified: Secondary | ICD-10-CM

## 2021-01-27 DIAGNOSIS — I10 Essential (primary) hypertension: Secondary | ICD-10-CM

## 2021-01-27 DIAGNOSIS — D500 Iron deficiency anemia secondary to blood loss (chronic): Secondary | ICD-10-CM

## 2021-01-27 DIAGNOSIS — E876 Hypokalemia: Secondary | ICD-10-CM

## 2021-01-27 DIAGNOSIS — T502X5A Adverse effect of carbonic-anhydrase inhibitors, benzothiadiazides and other diuretics, initial encounter: Secondary | ICD-10-CM

## 2021-01-27 DIAGNOSIS — K5904 Chronic idiopathic constipation: Secondary | ICD-10-CM

## 2021-01-27 DIAGNOSIS — Z23 Encounter for immunization: Secondary | ICD-10-CM | POA: Diagnosis not present

## 2021-01-27 DIAGNOSIS — N920 Excessive and frequent menstruation with regular cycle: Secondary | ICD-10-CM

## 2021-01-27 LAB — BASIC METABOLIC PANEL
BUN: 16 mg/dL (ref 6–23)
CO2: 29 mEq/L (ref 19–32)
Calcium: 9.7 mg/dL (ref 8.4–10.5)
Chloride: 102 mEq/L (ref 96–112)
Creatinine, Ser: 0.84 mg/dL (ref 0.40–1.20)
GFR: 81.86 mL/min (ref >60.00)
Glucose, Bld: 85 mg/dL (ref 70–99)
Potassium: 3 mEq/L — ABNORMAL LOW (ref 3.5–5.1)
Sodium: 139 mEq/L (ref 135–145)

## 2021-01-27 LAB — CBC
HCT: 33.8 % — ABNORMAL LOW (ref 36.0–46.0)
Hemoglobin: 11.4 g/dL — ABNORMAL LOW (ref 12.0–15.0)
MCHC: 33.6 g/dL (ref 30.0–36.0)
MCV: 87.8 fl (ref 78.0–100.0)
Platelets: 377 10*3/uL (ref 150.0–400.0)
RBC: 3.85 Mil/uL — ABNORMAL LOW (ref 3.87–5.11)
RDW: 14.4 % (ref 11.5–15.5)
WBC: 4.1 10*3/uL (ref 4.0–10.5)

## 2021-01-27 LAB — HEMOGLOBIN A1C: Hgb A1c MFr Bld: 5.5 % (ref 4.6–6.5)

## 2021-01-27 MED ORDER — LINACLOTIDE 145 MCG PO CAPS
ORAL_CAPSULE | ORAL | 3 refills | Status: DC
Start: 2021-01-27 — End: 2021-07-11

## 2021-01-27 MED ORDER — AMLODIPINE BESYLATE 10 MG PO TABS: ORAL_TABLET | ORAL | 3 refills | Status: DC

## 2021-01-27 MED ORDER — HEMATINIC/FOLIC ACID 324-1 MG PO TABS: ORAL_TABLET | ORAL | 0 refills | Status: DC

## 2021-01-27 MED ORDER — VALSARTAN 160 MG PO TABS: 160.0000 mg | ORAL_TABLET | Freq: Every day | ORAL | 3 refills | Status: DC

## 2021-01-27 NOTE — Patient Instructions (Signed)
Go to lab for blood draw Continue current medications

## 2021-01-27 NOTE — Progress Notes (Signed)
Subjective:  Patient ID: Jane Hampton, female    DOB: [birth year]  Age: 50 y.o. MRN: [id number]  CC: Follow-up (Medication refill needed (Linzess), Pt would also like to discuss if she should start iron medication again. )  HPI  Essential hypertension BP at goal with diovan, amlodipine and maxzide BP Readings from Last 3 Encounters:  01/27/21 102/64  04/02/20 120/80  03/07/20 126/80   Maintain surrect med doses  check Cbc and bmp F/up in 51months  Chronic idiopathic constipation Stable with linzess Refill sent  Iron deficiency anemia due to chronic blood loss Normal colonoscopy 2021, repeat in 38yrs Repeat cbc and iron panel  Hyperglycemia Repeat hgbA1c  Wt Readings from Last 3 Encounters:  01/27/21 177 lb 12.8 oz (80.6 kg)  04/02/20 183 lb (83 kg)  03/07/20 181 lb 3.2 oz (82.2 kg)   Reviewed past Medical, Social and Family history today.  Outpatient Medications Prior to Visit  Medication Sig Dispense Refill   amLODipine (NORVASC) 10 MG tablet TAKE 1 TABLET BY MOUTH EVERYDAY AT BEDTIME 90 tablet 3   LINZESS 145 MCG CAPS capsule TAKE 1 CAPSULE (145 MCG TOTAL) BY MOUTH DAILY BEFORE BREAKFAST. 90 capsule 3   valsartan (DIOVAN) 160 MG tablet TAKE 1 TABLET BY MOUTH EVERY DAY 90 tablet 3   HEMATINIC/FOLIC ACID 324-1 MG TABS TAKE 1 TABLET BY MOUTH EVERY DAY WITH BREAKFAST (Patient not taking: Reported on 01/27/2021) 30 tablet 5   ibuprofen (ADVIL,MOTRIN) 800 MG tablet  (Patient not taking: Reported on 01/27/2021)  0   phenylephrine-shark liver oil-mineral oil-petrolatum (PREPARATION H) 0.25-14-74.9 % rectal ointment Place 1 application rectally 2 (two) times daily as needed for hemorrhoids. (Patient not taking: Reported on 01/27/2021)     triamterene-hydrochlorothiazide (MAXZIDE-25) 37.5-25 MG tablet TAKE 1 TABLET BY MOUTH EVERY DAY (Patient not taking: Reported on 01/27/2021) 90 tablet 0   No facility-administered medications prior to visit.    ROS See  HPI  Objective:  BP 102/64 (BP Location: Left Arm, Patient Position: Sitting, Cuff Size: Normal)   Temp (!) 97.1 F (36.2 C) (Temporal)   Ht 5\' 4"  (1.626 m)   Wt 177 lb 12.8 oz (80.6 kg)   BMI 30.52 kg/m   Physical Exam Cardiovascular:     Rate and Rhythm: Normal rate.     Pulses: Normal pulses.  Pulmonary:     Effort: Pulmonary effort is normal.  Musculoskeletal:     Right lower leg: No edema.     Left lower leg: No edema.  Neurological:     Mental Status: She is alert and oriented to person, place, and time.   Assessment & Plan:  This visit occurred during the SARS-CoV-2 public health emergency.  Safety protocols were in place, including screening questions prior to the visit, additional usage of staff PPE, and extensive cleaning of exam room while observing appropriate contact time as indicated for disinfecting solutions.   Jane Hampton was seen today for follow-up.  Diagnoses and all orders for this visit:  Iron deficiency anemia due to chronic blood loss -     Iron, TIBC and Ferritin Panel -     CBC -     Ferrous Fumarate-Folic Acid (HEMATINIC/FOLIC ACID) 324-1 MG TABS; TAKE 1 TABLET BY MOUTH EVERY DAY WITH BREAKFAST  Essential hypertension -     Basic metabolic panel -     valsartan (DIOVAN) 160 MG tablet; Take 1 tablet (160 mg total) by mouth daily. -     amLODipine (NORVASC) 10 MG tablet; TAKE  1 TABLET BY MOUTH EVERYDAY AT BEDTIME  Hyperglycemia -     Hemoglobin A1c  Flu vaccine need -     Flu Vaccine QUAD 6+ mos PF IM (Fluarix Quad PF)  Chronic idiopathic constipation -     linaclotide (LINZESS) 145 MCG CAPS capsule; TAKE 1 CAPSULE (145 MCG TOTAL) BY MOUTH DAILY BEFORE BREAKFAST.   Problem List Items Addressed This Visit       Cardiovascular and Mediastinum   Essential hypertension    BP at goal with diovan, amlodipine and maxzide BP Readings from Last 3 Encounters:  01/27/21 102/64  04/02/20 120/80  03/07/20 126/80   Maintain surrect med doses   check Cbc and bmp F/up in 73months      Relevant Medications   valsartan (DIOVAN) 160 MG tablet   amLODipine (NORVASC) 10 MG tablet   Other Relevant Orders   Basic metabolic panel     Digestive   Chronic idiopathic constipation    Stable with linzess Refill sent      Relevant Medications   linaclotide (LINZESS) 145 MCG CAPS capsule     Other   Hyperglycemia    Repeat hgbA1c      Relevant Orders   Hemoglobin A1c   Iron deficiency anemia due to chronic blood loss - Primary    Normal colonoscopy 2021, repeat in 33yrs Repeat cbc and iron panel      Relevant Medications   Ferrous Fumarate-Folic Acid (HEMATINIC/FOLIC ACID) 324-1 MG TABS   Other Relevant Orders   Iron, TIBC and Ferritin Panel   CBC   Other Visit Diagnoses     Flu vaccine need       Relevant Orders   Flu Vaccine QUAD 6+ mos PF IM (Fluarix Quad PF)       Follow-up: Return in about 3 months (around 04/29/2021) for CPE (fasting).  Alysia Penna, NP

## 2021-01-27 NOTE — Assessment & Plan Note (Signed)
BP at goal with diovan, amlodipine and maxzide BP Readings from Last 3 Encounters:  01/27/21 102/64  04/02/20 120/80  03/07/20 126/80   Maintain surrect med doses  check Cbc and bmp F/up in 53months

## 2021-01-27 NOTE — Assessment & Plan Note (Signed)
Repeat hgbA1c 

## 2021-01-27 NOTE — Assessment & Plan Note (Signed)
Stable with linzess Refill sent

## 2021-01-27 NOTE — Assessment & Plan Note (Signed)
Normal colonoscopy 2021, repeat in 75yrs Repeat cbc and iron panel

## 2021-01-28 LAB — IRON,TIBC AND FERRITIN PANEL
%SAT: 5 % (calc) — ABNORMAL LOW (ref 16–45)
Ferritin: 6 ng/mL — ABNORMAL LOW (ref 16–232)
Iron: 20 ug/dL — ABNORMAL LOW (ref 40–190)
TIBC: 371 mcg/dL (calc) (ref 250–450)

## 2021-01-28 MED ORDER — TRIAMTERENE-HCTZ 37.5-25 MG PO TABS: 0.5000 | ORAL_TABLET | Freq: Every day | ORAL | 0 refills | Status: DC

## 2021-01-28 MED ORDER — POTASSIUM CHLORIDE CRYS ER 20 MEQ PO TBCR: 20.0000 meq | EXTENDED_RELEASE_TABLET | Freq: Two times a day (BID) | ORAL | 0 refills | Status: DC

## 2021-01-28 NOTE — Addendum Note (Signed)
Addended by: Alysia Penna L on: 01/28/2021 03:45 PM   Modules accepted: Orders

## 2021-03-06 ENCOUNTER — Ambulatory Visit: Payer: BC Managed Care – PPO | Admitting: Nurse Practitioner

## 2021-03-06 DIAGNOSIS — Z0289 Encounter for other administrative examinations: Secondary | ICD-10-CM

## 2021-04-28 ENCOUNTER — Other Ambulatory Visit: Payer: Self-pay | Admitting: Nurse Practitioner

## 2021-04-28 DIAGNOSIS — D500 Iron deficiency anemia secondary to blood loss (chronic): Secondary | ICD-10-CM

## 2021-04-29 ENCOUNTER — Encounter: Payer: BC Managed Care – PPO | Admitting: Nurse Practitioner

## 2021-04-29 NOTE — Telephone Encounter (Signed)
Chart supports Rx Last seen 01/27/21 Next OV 05/30/21

## 2021-05-30 ENCOUNTER — Encounter: Payer: BC Managed Care – PPO | Admitting: Nurse Practitioner

## 2021-06-07 ENCOUNTER — Other Ambulatory Visit: Payer: Self-pay | Admitting: Nurse Practitioner

## 2021-06-07 DIAGNOSIS — I10 Essential (primary) hypertension: Secondary | ICD-10-CM

## 2021-06-16 ENCOUNTER — Telehealth: Payer: Self-pay | Admitting: Nurse Practitioner

## 2021-06-16 NOTE — Telephone Encounter (Signed)
Pt needs a refuill on her hematinic.

## 2021-06-16 NOTE — Telephone Encounter (Signed)
Informed patient that her Rx was sent in on 04/29/21 with 2 refills, and all she has to do is let her pharmacy know she is ready for her refill. Pt verbalized understanding.

## 2021-06-17 ENCOUNTER — Other Ambulatory Visit: Payer: Self-pay | Admitting: Nurse Practitioner

## 2021-06-17 DIAGNOSIS — Z1231 Encounter for screening mammogram for malignant neoplasm of breast: Secondary | ICD-10-CM

## 2021-06-30 ENCOUNTER — Ambulatory Visit
Admission: RE | Admit: 2021-06-30 | Discharge: 2021-06-30 | Disposition: A | Discharge status: Home / Self Care | Payer: BC Managed Care – PPO | Source: Ambulatory Visit | Attending: Nurse Practitioner | Admitting: Nurse Practitioner

## 2021-06-30 DIAGNOSIS — Z1231 Encounter for screening mammogram for malignant neoplasm of breast: Secondary | ICD-10-CM | POA: Diagnosis not present

## 2021-07-01 ENCOUNTER — Other Ambulatory Visit: Payer: Self-pay | Admitting: Nurse Practitioner

## 2021-07-01 DIAGNOSIS — R928 Other abnormal and inconclusive findings on diagnostic imaging of breast: Secondary | ICD-10-CM

## 2021-07-10 ENCOUNTER — Encounter: Payer: BC Managed Care – PPO | Admitting: Nurse Practitioner

## 2021-07-11 ENCOUNTER — Other Ambulatory Visit: Payer: Self-pay

## 2021-07-11 ENCOUNTER — Encounter: Payer: Self-pay | Admitting: Nurse Practitioner

## 2021-07-11 ENCOUNTER — Ambulatory Visit (INDEPENDENT_AMBULATORY_CARE_PROVIDER_SITE_OTHER): Payer: BC Managed Care – PPO | Admitting: Nurse Practitioner

## 2021-07-11 VITALS — BP 118/72 | HR 80 | Temp 97.8°F | Ht 64.0 in | Wt 183.8 lb

## 2021-07-11 DIAGNOSIS — T502X5A Adverse effect of carbonic-anhydrase inhibitors, benzothiadiazides and other diuretics, initial encounter: Secondary | ICD-10-CM

## 2021-07-11 DIAGNOSIS — Z13220 Encounter for screening for lipoid disorders: Secondary | ICD-10-CM | POA: Diagnosis not present

## 2021-07-11 DIAGNOSIS — K5904 Chronic idiopathic constipation: Secondary | ICD-10-CM | POA: Diagnosis not present

## 2021-07-11 DIAGNOSIS — E876 Hypokalemia: Secondary | ICD-10-CM | POA: Diagnosis not present

## 2021-07-11 DIAGNOSIS — I10 Essential (primary) hypertension: Secondary | ICD-10-CM | POA: Diagnosis not present

## 2021-07-11 DIAGNOSIS — D500 Iron deficiency anemia secondary to blood loss (chronic): Secondary | ICD-10-CM | POA: Diagnosis not present

## 2021-07-11 DIAGNOSIS — Z136 Encounter for screening for cardiovascular disorders: Secondary | ICD-10-CM | POA: Diagnosis not present

## 2021-07-11 DIAGNOSIS — Z0001 Encounter for general adult medical examination with abnormal findings: Secondary | ICD-10-CM

## 2021-07-11 LAB — CBC WITH DIFFERENTIAL/PLATELET
Basophils Absolute: 0.1 10*3/uL (ref 0.0–0.1)
Basophils Relative: 1.8 % (ref 0.0–3.0)
Eosinophils Absolute: 0.1 10*3/uL (ref 0.0–0.7)
Eosinophils Relative: 2.8 % (ref 0.0–5.0)
HCT: 38.2 % (ref 36.0–46.0)
Hemoglobin: 13 g/dL (ref 12.0–15.0)
Lymphocytes Relative: 29.9 % (ref 12.0–46.0)
Lymphs Abs: 1.6 10*3/uL (ref 0.7–4.0)
MCHC: 34.2 g/dL (ref 30.0–36.0)
MCV: 89.2 fl (ref 78.0–100.0)
Monocytes Absolute: 0.6 10*3/uL (ref 0.1–1.0)
Monocytes Relative: 11.6 % (ref 3.0–12.0)
Neutro Abs: 2.9 10*3/uL (ref 1.4–7.7)
Neutrophils Relative %: 53.9 % (ref 43.0–77.0)
Platelets: 280 10*3/uL (ref 150.0–400.0)
RBC: 4.28 Mil/uL (ref 3.87–5.11)
RDW: 14.4 % (ref 11.5–15.5)
WBC: 5.3 10*3/uL (ref 4.0–10.5)

## 2021-07-11 LAB — COMPREHENSIVE METABOLIC PANEL
ALT: 26 U/L (ref 0–35)
AST: 27 U/L (ref 0–37)
Albumin: 4.2 g/dL (ref 3.5–5.2)
Alkaline Phosphatase: 43 U/L (ref 39–117)
BUN: 14 mg/dL (ref 6–23)
CO2: 30 mEq/L (ref 19–32)
Calcium: 10.1 mg/dL (ref 8.4–10.5)
Chloride: 102 mEq/L (ref 96–112)
Creatinine, Ser: 0.81 mg/dL (ref 0.40–1.20)
GFR: 85.24 mL/min (ref >60.00)
Glucose, Bld: 84 mg/dL (ref 70–99)
Potassium: 3.2 mEq/L — ABNORMAL LOW (ref 3.5–5.1)
Sodium: 138 mEq/L (ref 135–145)
Total Bilirubin: 0.6 mg/dL (ref 0.2–1.2)
Total Protein: 6.6 g/dL (ref 6.0–8.3)

## 2021-07-11 LAB — LIPID PANEL
Cholesterol: 169 mg/dL (ref 0–200)
HDL: 80.2 mg/dL (ref >39.00)
LDL Cholesterol: 79 mg/dL (ref 0–99)
NonHDL: 88.38
Total CHOL/HDL Ratio: 2
Triglycerides: 46 mg/dL (ref 0.0–149.0)
VLDL: 9.2 mg/dL (ref 0.0–40.0)

## 2021-07-11 LAB — TSH: TSH: 0.57 u[IU]/mL (ref 0.35–5.50)

## 2021-07-11 MED ORDER — LINACLOTIDE 290 MCG PO CAPS: 290.0000 ug | ORAL_CAPSULE | Freq: Every day | ORAL | 3 refills | Status: DC

## 2021-07-11 NOTE — Assessment & Plan Note (Addendum)
Use of oral iron supplement ?Mild constipation with medication ?Repeat cbc and iron panel ?

## 2021-07-11 NOTE — Assessment & Plan Note (Signed)
BP at goal ?BP Readings from Last 3 Encounters:  ?07/11/21 118/72  ?01/27/21 102/64  ?04/02/20 120/80  ? ?Maintain med doses refill sent ?

## 2021-07-11 NOTE — Assessment & Plan Note (Signed)
Needs increase in linzess dose. No ABD pain, no nausea, no weight loss ?Reports mild constipation despite high fiber diet and medication compliance ?Up to date with colonoscopy. ?Stays active at work, but a exercise at home ? ?Sent new linzess rx ? ?

## 2021-07-11 NOTE — Patient Instructions (Signed)
Go to lab for blood draw ? ?Start regular exercise (162mns of moderate intensity exercise per week) ? ?Preventive Care Jane Hampton, Female ?Preventive care refers to lifestyle choices and visits with your health care provider that can promote health and wellness. Preventive care visits are also called wellness exams. ?What can I expect for my preventive care visit? ?Counseling ?Your health care provider may ask you questions about your: ?Medical history, including: ?Past medical problems. ?Family medical history. ?Pregnancy history. ?Current health, including: ?Menstrual cycle. ?Method of birth control. ?Emotional well-being. ?Home life and relationship well-being. ?Sexual activity and sexual health. ?Lifestyle, including: ?Alcohol, nicotine or tobacco, and drug use. ?Access to firearms. ?Diet, exercise, and sleep habits. ?Work and work eStatistician ?Sunscreen use. ?Safety issues such as seatbelt and bike helmet use. ?Physical exam ?Your health care provider will check your: ?Height and weight. These may be used to calculate your BMI (body mass index). BMI is a measurement that tells if you are at a healthy weight. ?Waist circumference. This measures the distance around your waistline. This measurement also tells if you are at a healthy weight and may help predict your risk of certain diseases, such as type 2 diabetes and high blood pressure. ?Heart rate and blood pressure. ?Body temperature. ?Skin for abnormal spots. ?What immunizations do I need? ?Vaccines are usually given at various ages, according to a schedule. Your health care provider will recommend vaccines for you based on your age, medical history, and lifestyle or other factors, such as travel or where you work. ?What tests do I need? ?Screening ?Your health care provider may recommend screening tests for certain conditions. This may include: ?Lipid and cholesterol levels. ?Diabetes screening. This is done by checking your blood sugar (glucose)  after you have not eaten for a while (fasting). ?Pelvic exam and Pap test. ?Hepatitis B test. ?Hepatitis C test. ?HIV (human immunodeficiency virus) test. ?STI (sexually transmitted infection) testing, if you are at risk. ?Lung cancer screening. ?Colorectal cancer screening. ?Mammogram. Talk with your health care provider about when you should start having regular mammograms. This may depend on whether you have a family history of breast cancer. ?BRCA-related cancer screening. This may be done if you have a family history of breast, ovarian, tubal, or peritoneal cancers. ?Bone density scan. This is done to screen for osteoporosis. ?Talk with your health care provider about your test results, treatment options, and if necessary, the need for more tests. ?Follow these instructions at home: ?Eating and drinking ? ?Eat a diet that includes fresh fruits and vegetables, whole grains, lean protein, and low-fat dairy products. ?Take vitamin and mineral supplements as recommended by your health care provider. ?Do not drink alcohol if: ?Your health care provider tells you not to drink. ?You are pregnant, may be pregnant, or are planning to become pregnant. ?If you drink alcohol: ?Limit how much you have to 0-1 drink a day. ?Know how much alcohol is in your drink. In the [place] one drink equals one 12 oz bottle of beer (355 mL), one 5 oz glass of wine (148 mL), or one 1? oz glass of hard liquor (44 mL). ?Lifestyle ?Brush your teeth every morning and night with fluoride toothpaste. Floss one time each day. ?Exercise for at least 30 minutes 5 or more days each week. ?Do not use any products that contain nicotine or tobacco. These products include cigarettes, chewing tobacco, and vaping devices, such as e-cigarettes. If you need help quitting, ask your health care provider. ?Do not use drugs. ?  If you are sexually active, practice safe sex. Use a condom or other form of protection to prevent STIs. ?If you do not wish to become  pregnant, use a form of birth control. If you plan to become pregnant, see your health care provider for a prepregnancy visit. ?Take aspirin only as told by your health care provider. Make sure that you understand how much to take and what form to take. Work with your health care provider to find out whether it is safe and beneficial for you to take aspirin daily. ?Find healthy ways to manage stress, such as: ?Meditation, yoga, or listening to music. ?Journaling. ?Talking to a trusted person. ?Spending time with friends and family. ?Minimize exposure to UV radiation to reduce your risk of skin cancer. ?Safety ?Always wear your seat belt while driving or riding in a vehicle. ?Do not drive: ?If you have been drinking alcohol. Do not ride with someone who has been drinking. ?When you are tired or distracted. ?While texting. ?If you have been using any mind-altering substances or drugs. ?Wear a helmet and other protective equipment during sports activities. ?If you have firearms in your house, make sure you follow all gun safety procedures. ?Seek help if you have been physically or sexually abused. ?What's next? ?Visit your health care provider once a year for an annual wellness visit. ?Ask your health care provider how often you should have your eyes and teeth checked. ?Stay up to date on all vaccines. ?This information is not intended to replace advice given to you by your health care provider. Make sure you discuss any questions you have with your health care provider. ?Document Revised: 10/09/2020 Document Reviewed: 10/09/2020 ?Elsevier Patient Education ? [place]. ? ?

## 2021-07-11 NOTE — Progress Notes (Signed)
Complete physical exam  Patient: Jane Hampton   DOB: 03-Apr-1971   51 y.o. Female  MRN: [id number] Visit Date: 07/11/2021  Subjective:    Chief Complaint  Patient presents with   Annual Exam    Physical-No breast or pap exam needed.  Pt is fasting.  Pt would also like to discuss possible increase in Linzess    Jane Hampton is a 51 y.o. female who presents today for a complete physical exam. She reports consuming a low fat and low sodium diet. Exercise is limited by work schedule. She generally feels well. She reports sleeping well. She does have additional problems to discuss today.  Vision:No Dental:Within Last 6 months STD Screen:No  Wt Readings from Last 3 Encounters:  07/11/21 183 lb 12.8 oz (83.4 kg)  01/27/21 177 lb 12.8 oz (80.6 kg)  04/02/20 183 lb (83 kg)    Most recent functional status assessment: No flowsheet data found.  Most recent fall risk assessment: Fall Risk  07/11/2021  Falls in the past year? 0  Number falls in past yr: 0  Injury with Fall? 0  Risk for fall due to : No Fall Risks  Follow up Falls evaluation completed     Most recent depression screenings: PHQ 2/9 Scores 07/11/2021 01/27/2021  PHQ - 2 Score 0 0  PHQ- 9 Score 1 -    Most recent cognitive screening: No flowsheet data found.  HPI  Essential hypertension BP at goal BP Readings from Last 3 Encounters:  07/11/21 118/72  01/27/21 102/64  04/02/20 120/80   Maintain med doses refill sent  Chronic idiopathic constipation Needs increase in linzess dose. No ABD pain, no nausea, no weight loss Reports mild constipation despite high fiber diet and medication compliance Up to date with colonoscopy. Stays active at work, but a exercise at home  Sent new linzess rx   Iron deficiency anemia due to chronic blood loss Use of oral iron supplement Mild constipation with medication Repeat cbc and iron panel  Past Medical History:  Diagnosis Date   Hematochezia 09/06/2015    Hypertension    Migraines    Past Surgical History:  Procedure Laterality Date   CESAREAN SECTION     TONSILLECTOMY     TUBAL LIGATION     Social History   Socioeconomic History   Marital status: Married    Spouse name: Not on file   Number of children: 3   Years of education: Not on file   Highest education level: Not on file  Occupational History   Occupation: certified nursing assistant  Tobacco Use   Smoking status: Former    Years: 15.00    Types: Cigarettes    Quit date: 10/02/2000    Years since quitting: 20.7   Smokeless tobacco: Never  Vaping Use   Vaping Use: Never used  Substance and Sexual Activity   Alcohol use: No    Alcohol/week: 0.0 standard drinks   Drug use: No   Sexual activity: Yes    Birth control/protection: Surgical  Other Topics Concern   Not on file  Social History Narrative   Not on file   Social Determinants of Health   Financial Resource Strain: Not on file  Food Insecurity: Not on file  Transportation Needs: Not on file  Physical Activity: Not on file  Stress: Not on file  Social Connections: Not on file  Intimate Partner Violence: Not on file   Family Status  Relation Name Status   Mother  (Not  Specified)   Father  (Not Specified)   Sister  (Not Specified)   MGM  (Not Specified)   Neg Hx  (Not Specified)   Family History  Problem Relation Age of Onset   Hypertension Mother    Heart disease Mother    Diabetes Mother    Hypertension Father    Anuerysm Father        brain   Anuerysm Sister        brain   Diabetes Maternal Grandmother    Breast cancer Neg Hx    Allergies  Allergen Reactions   Lisinopril Swelling    Severe upper lip swelling    Patient Care Team: Izack Hoogland, Bonna Gains, NP as PCP - General (Internal Medicine)   Medications: Outpatient Medications Prior to Visit  Medication Sig   amLODipine (NORVASC) 10 MG tablet TAKE 1 TABLET BY MOUTH EVERYDAY AT BEDTIME   HEMATINIC/FOLIC ACID 324-1 MG TABS TAKE  1 TABLET BY MOUTH EVERY DAY WITH BREAKFAST   potassium chloride SA (KLOR-CON) 20 MEQ tablet Take 1 tablet (20 mEq total) by mouth 2 (two) times daily.   triamterene-hydrochlorothiazide (MAXZIDE-25) 37.5-25 MG tablet TAKE 1/2 TABLET BY MOUTH EVERY DAY   valsartan (DIOVAN) 160 MG tablet Take 1 tablet (160 mg total) by mouth daily.   [DISCONTINUED] linaclotide (LINZESS) 145 MCG CAPS capsule TAKE 1 CAPSULE (145 MCG TOTAL) BY MOUTH DAILY BEFORE BREAKFAST.   No facility-administered medications prior to visit.   Review of Systems  Constitutional:  Negative for fever.  HENT:  Negative for congestion and sore throat.   Eyes:        Negative for visual changes  Respiratory:  Negative for cough and shortness of breath.   Cardiovascular:  Negative for chest pain, palpitations and leg swelling.  Gastrointestinal:  Negative for blood in stool, constipation and diarrhea.  Genitourinary:  Negative for dysuria, frequency and urgency.  Musculoskeletal:  Negative for myalgias.  Skin:  Negative for rash.  Neurological:  Negative for dizziness and headaches.  Hematological:  Does not bruise/bleed easily.  Psychiatric/Behavioral:  Negative for suicidal ideas. The patient is not nervous/anxious.        Objective:  BP 118/72 (BP Location: Left Arm, Patient Position: Sitting, Cuff Size: Large)   Pulse 80   Temp 97.8 F (36.6 C) (Temporal)   Ht 5\' 4"  (1.626 m)   Wt 183 lb 12.8 oz (83.4 kg)   LMP 06/12/2021   BMI 31.55 kg/m       Physical Exam Vitals reviewed.  Constitutional:      General: She is not in acute distress.    Appearance: She is well-developed. She is obese.  HENT:     Right Ear: Tympanic membrane, ear canal and external ear normal.     Left Ear: Tympanic membrane, ear canal and external ear normal.  Eyes:     Extraocular Movements: Extraocular movements intact.     Conjunctiva/sclera: Conjunctivae normal.  Cardiovascular:     Rate and Rhythm: Normal rate and regular rhythm.      Pulses: Normal pulses.     Heart sounds: Normal heart sounds.  Pulmonary:     Effort: Pulmonary effort is normal. No respiratory distress.     Breath sounds: Normal breath sounds.  Chest:     Chest wall: No tenderness.  Abdominal:     General: Bowel sounds are normal.     Palpations: Abdomen is soft.  Musculoskeletal:        General: Normal range of  motion.     Cervical back: Normal range of motion and neck supple.     Right lower leg: No edema.     Left lower leg: No edema.  Skin:    General: Skin is warm and dry.  Neurological:     Mental Status: She is alert and oriented to person, place, and time.     Deep Tendon Reflexes: Reflexes are normal and symmetric.  Psychiatric:        Mood and Affect: Mood normal.        Behavior: Behavior normal.        Thought Content: Thought content normal.     No results found for any visits on 07/11/21.    Assessment & Plan:    Routine Health Maintenance and Physical Exam  Immunization History  Administered Date(s) Administered   Influenza,inj,Quad PF,6+ Mos 01/10/2016, 01/05/2017, 02/28/2019, 01/27/2021   Influenza-Unspecified 02/21/2020   PFIZER(Purple Top)SARS-COV-2 Vaccination 07/19/2019, 08/09/2019, 03/28/2020   PPD Test 05/22/2015, 06/17/2016   Tdap 04/24/2014    Health Maintenance  Topic Date Due   COVID-19 Vaccine (4 - Booster for Pfizer series) 05/23/2020   PAP SMEAR-Modifier  03/08/2023   TETANUS/TDAP  04/24/2024   COLONOSCOPY (Pts 45-24yrs Insurance coverage will need to be confirmed)  10/16/2025   INFLUENZA VACCINE  Completed   HIV Screening  Completed   HPV VACCINES  Aged Out   Hepatitis C Screening  Discontinued    Discussed health benefits of physical activity, and encouraged her to engage in regular exercise appropriate for her age and condition.  Problem List Items Addressed This Visit       Cardiovascular and Mediastinum   Essential hypertension    BP at goal BP Readings from Last 3 Encounters:   07/11/21 118/72  01/27/21 102/64  04/02/20 120/80   Maintain med doses refill sent      Relevant Orders   TSH     Digestive   Chronic idiopathic constipation    Needs increase in linzess dose. No ABD pain, no nausea, no weight loss Reports mild constipation despite high fiber diet and medication compliance Up to date with colonoscopy. Stays active at work, but a exercise at home  Sent new linzess rx       Relevant Medications   linaclotide (LINZESS) 290 MCG CAPS capsule     Other   Iron deficiency anemia due to chronic blood loss    Use of oral iron supplement Mild constipation with medication Repeat cbc and iron panel      Relevant Orders   CBC with Differential/Platelet   Iron, TIBC and Ferritin Panel   Other Visit Diagnoses     Encounter for preventative adult health care exam with abnormal findings    -  Primary   Relevant Orders   Comprehensive metabolic panel   Lipid panel   Encounter for lipid screening for cardiovascular disease       Relevant Orders   Lipid panel      Return in about 6 months (around 01/11/2022) for HTN.     Alysia Penna, NP

## 2021-07-12 LAB — IRON,TIBC AND FERRITIN PANEL
%SAT: 27 % (calc) (ref 16–45)
Ferritin: 37 ng/mL (ref 16–232)
Iron: 87 ug/dL (ref 40–190)
TIBC: 326 mcg/dL (calc) (ref 250–450)

## 2021-07-14 MED ORDER — POTASSIUM CHLORIDE CRYS ER 20 MEQ PO TBCR: 20.0000 meq | EXTENDED_RELEASE_TABLET | Freq: Every day | ORAL | 0 refills | Status: DC

## 2021-07-14 NOTE — Addendum Note (Signed)
Addended by: Michaela Corner on: 07/14/2021 02:47 PM ? ? Modules accepted: Orders ? ?

## 2021-07-23 ENCOUNTER — Other Ambulatory Visit: Payer: Self-pay | Admitting: Nurse Practitioner

## 2021-07-23 ENCOUNTER — Ambulatory Visit
Admission: RE | Admit: 2021-07-23 | Discharge: 2021-07-23 | Disposition: A | Discharge status: Home / Self Care | Payer: BC Managed Care – PPO | Source: Ambulatory Visit | Attending: Nurse Practitioner | Admitting: Nurse Practitioner

## 2021-07-23 DIAGNOSIS — R922 Inconclusive mammogram: Secondary | ICD-10-CM | POA: Diagnosis not present

## 2021-07-23 DIAGNOSIS — R928 Other abnormal and inconclusive findings on diagnostic imaging of breast: Secondary | ICD-10-CM

## 2021-07-23 DIAGNOSIS — N6489 Other specified disorders of breast: Secondary | ICD-10-CM

## 2021-07-25 ENCOUNTER — Other Ambulatory Visit: Payer: Self-pay | Admitting: Nurse Practitioner

## 2021-07-25 DIAGNOSIS — D500 Iron deficiency anemia secondary to blood loss (chronic): Secondary | ICD-10-CM

## 2021-08-11 ENCOUNTER — Other Ambulatory Visit: Payer: Self-pay | Admitting: Nurse Practitioner

## 2021-08-11 DIAGNOSIS — E876 Hypokalemia: Secondary | ICD-10-CM

## 2021-08-12 NOTE — Telephone Encounter (Signed)
Chart supports rx refill ?Last ov: 07/11/21 ?Last refill: 07/14/2021 ? ? ?

## 2021-09-05 ENCOUNTER — Other Ambulatory Visit: Payer: Self-pay | Admitting: Nurse Practitioner

## 2021-09-05 DIAGNOSIS — I10 Essential (primary) hypertension: Secondary | ICD-10-CM

## 2021-09-15 ENCOUNTER — Other Ambulatory Visit: Payer: Self-pay | Admitting: Nurse Practitioner

## 2021-09-15 DIAGNOSIS — E876 Hypokalemia: Secondary | ICD-10-CM

## 2021-09-18 ENCOUNTER — Other Ambulatory Visit (INDEPENDENT_AMBULATORY_CARE_PROVIDER_SITE_OTHER): Payer: BC Managed Care – PPO

## 2021-09-18 DIAGNOSIS — T502X5A Adverse effect of carbonic-anhydrase inhibitors, benzothiadiazides and other diuretics, initial encounter: Secondary | ICD-10-CM | POA: Diagnosis not present

## 2021-09-18 DIAGNOSIS — E876 Hypokalemia: Secondary | ICD-10-CM | POA: Diagnosis not present

## 2021-09-18 DIAGNOSIS — I10 Essential (primary) hypertension: Secondary | ICD-10-CM

## 2021-09-18 LAB — BASIC METABOLIC PANEL
BUN: 9 mg/dL (ref 6–23)
CO2: 26 mEq/L (ref 19–32)
Calcium: 9.1 mg/dL (ref 8.4–10.5)
Chloride: 105 mEq/L (ref 96–112)
Creatinine, Ser: 0.77 mg/dL (ref 0.40–1.20)
GFR: 90.46 mL/min (ref >60.00)
Glucose, Bld: 117 mg/dL — ABNORMAL HIGH (ref 70–99)
Potassium: 3 mEq/L — ABNORMAL LOW (ref 3.5–5.1)
Sodium: 139 mEq/L (ref 135–145)

## 2021-09-19 MED ORDER — POTASSIUM CHLORIDE CRYS ER 20 MEQ PO TBCR: 20.0000 meq | EXTENDED_RELEASE_TABLET | Freq: Two times a day (BID) | ORAL | 0 refills | Status: DC

## 2021-09-19 NOTE — Addendum Note (Signed)
Addended by: Michaela Corner on: 09/19/2021 02:28 PM   Modules accepted: Orders

## 2021-09-19 NOTE — Assessment & Plan Note (Signed)
D/c maxzide to persistent hypokalemia Potassium supplement sent F/up in 58month for repeat BMP and HTN re eval

## 2021-10-20 ENCOUNTER — Other Ambulatory Visit: Payer: Self-pay | Admitting: Nurse Practitioner

## 2021-10-20 DIAGNOSIS — E876 Hypokalemia: Secondary | ICD-10-CM

## 2021-10-27 ENCOUNTER — Other Ambulatory Visit: Payer: Self-pay | Admitting: Nurse Practitioner

## 2021-10-27 DIAGNOSIS — D500 Iron deficiency anemia secondary to blood loss (chronic): Secondary | ICD-10-CM

## 2021-10-27 NOTE — Telephone Encounter (Signed)
Chart supports Rx Last OV: 06/2021 Next OV: Not scheduled

## 2021-12-06 ENCOUNTER — Other Ambulatory Visit: Payer: Self-pay | Admitting: Nurse Practitioner

## 2021-12-06 DIAGNOSIS — I10 Essential (primary) hypertension: Secondary | ICD-10-CM

## 2021-12-15 ENCOUNTER — Telehealth: Payer: Self-pay | Admitting: Nurse Practitioner

## 2021-12-15 NOTE — Telephone Encounter (Signed)
Called & spoke w/ pt says she needs a referral due to her hemorrhoids becoming unbearable. Appt scheduled

## 2021-12-15 NOTE — Telephone Encounter (Signed)
Pt needs a referral to a gastroentologist. She is having a lot of trouble with her Hemorid.

## 2021-12-17 ENCOUNTER — Ambulatory Visit: Payer: BC Managed Care – PPO | Admitting: Nurse Practitioner

## 2021-12-26 ENCOUNTER — Ambulatory Visit: Payer: BC Managed Care – PPO | Admitting: Nurse Practitioner

## 2022-01-06 ENCOUNTER — Encounter: Payer: Self-pay | Admitting: Nurse Practitioner

## 2022-01-06 ENCOUNTER — Ambulatory Visit (INDEPENDENT_AMBULATORY_CARE_PROVIDER_SITE_OTHER): Payer: BC Managed Care – PPO | Admitting: Nurse Practitioner

## 2022-01-06 VITALS — BP 130/58 | Temp 97.0°F | Ht 64.0 in | Wt 182.0 lb

## 2022-01-06 DIAGNOSIS — Z23 Encounter for immunization: Secondary | ICD-10-CM

## 2022-01-06 DIAGNOSIS — K644 Residual hemorrhoidal skin tags: Secondary | ICD-10-CM | POA: Insufficient documentation

## 2022-01-06 HISTORY — DX: Residual hemorrhoidal skin tags: K64.4

## 2022-01-06 MED ORDER — WITCH HAZEL-GLYCERIN EX PADS: 1.0000 | MEDICATED_PAD | CUTANEOUS | 12 refills | Status: AC | PRN

## 2022-01-06 MED ORDER — HYDROCORTISONE (PERIANAL) 2.5 % EX CREA: 1.0000 | TOPICAL_CREAM | Freq: Two times a day (BID) | CUTANEOUS | 1 refills | Status: DC

## 2022-01-06 NOTE — Patient Instructions (Signed)
Start proctosol cream BID x 1week. Use tuck pads daily. Maintain high fiber diet and adequate oral hydration You will be contacted to schedule appt with proctologist

## 2022-01-06 NOTE — Assessment & Plan Note (Signed)
Chronic, intermittent, no active bleeding at this time, no rectal pain, no pain with BM, no constipation or diarrhea. She wants referral to another GI specialist. hemorrhoid banding by [facility] was unsuccessful.  Sent proctosol cream and advised to also use Tucks pads Entered referral to proctology.

## 2022-01-06 NOTE — Progress Notes (Signed)
                Established Patient Visit  Patient: Jane Hampton   DOB: 12-Jul-1970   51 y.o. Female  MRN: [id number] Visit Date: 01/06/2022  Subjective:    Chief Complaint  Patient presents with   Acute Visit    Pt having issues with hemorrhoids needs a referral.   HPI Bleeding external hemorrhoids Chronic, intermittent, no active bleeding at this time, no rectal pain, no pain with BM, no constipation or diarrhea. She wants referral to another GI specialist. hemorrhoid banding by [facility] was unsuccessful.  Sent proctosol cream and advised to also use Tucks pads Entered referral to proctology.  Reviewed medical, surgical, and social history today  Medications: Outpatient Medications Prior to Visit  Medication Sig   amLODipine (NORVASC) 10 MG tablet TAKE 1 TABLET BY MOUTH EVERYDAY AT BEDTIME   HEMATINIC/FOLIC ACID 324-1 MG TABS TAKE 1 TABLET BY MOUTH EVERY DAY WITH BREAKFAST   linaclotide (LINZESS) 290 MCG CAPS capsule Take 1 capsule (290 mcg total) by mouth daily before breakfast. TAKE 1 CAPSULE (145 MCG TOTAL) BY MOUTH DAILY BEFORE BREAKFAST.   triamterene-hydrochlorothiazide (MAXZIDE-25) 37.5-25 MG tablet Take 0.5 tablets by mouth daily.   valsartan (DIOVAN) 160 MG tablet Take 1 tablet (160 mg total) by mouth daily.   potassium chloride SA (KLOR-CON M20) 20 MEQ tablet Take 1 tablet (20 mEq total) by mouth 2 (two) times daily. (Patient not taking: Reported on 01/06/2022)   No facility-administered medications prior to visit.   Reviewed past medical and social history.   ROS per HPI above      Objective:  BP (!) 130/58 (BP Location: Right Arm, Patient Position: Sitting, Cuff Size: Normal)   Temp (!) 97 F (36.1 C) (Temporal)   Ht 5\' 4"  (1.626 m)   Wt 182 lb (82.6 kg)   BMI 31.24 kg/m      Physical Exam Constitutional:      General: She is not in acute distress. Abdominal:     Tenderness: There is no abdominal tenderness.  Neurological:     Mental  Status: She is alert and oriented to person, place, and time.     No results found for any visits on 01/06/22.    Assessment & Plan:    Problem List Items Addressed This Visit       Cardiovascular and Mediastinum   Bleeding external hemorrhoids - Primary    Chronic, intermittent, no active bleeding at this time, no rectal pain, no pain with BM, no constipation or diarrhea. She wants referral to another GI specialist. hemorrhoid banding by [facility] was unsuccessful.  Sent proctosol cream and advised to also use Tucks pads Entered referral to proctology.      Relevant Medications   triamterene-hydrochlorothiazide (MAXZIDE-25) 37.5-25 MG tablet   witch hazel-glycerin (TUCKS) pad   hydrocortisone (PROCTOSOL HC) 2.5 % rectal cream   Other Relevant Orders   Ambulatory referral to Colorectal Surgery   Other Visit Diagnoses     Need for immunization against influenza       Relevant Orders   Flu Vaccine QUAD 6+ mos PF IM (Fluarix Quad PF) (Completed)      Return if symptoms worsen or fail to improve.     02-01-1992, NP

## 2022-01-15 DIAGNOSIS — K648 Other hemorrhoids: Secondary | ICD-10-CM | POA: Diagnosis not present

## 2022-01-27 ENCOUNTER — Other Ambulatory Visit: Payer: Self-pay | Admitting: Nurse Practitioner

## 2022-01-27 ENCOUNTER — Ambulatory Visit
Admission: RE | Admit: 2022-01-27 | Discharge: 2022-01-27 | Disposition: A | Discharge status: Home / Self Care | Payer: BC Managed Care – PPO | Source: Ambulatory Visit | Attending: Nurse Practitioner | Admitting: Nurse Practitioner

## 2022-01-27 DIAGNOSIS — N6320 Unspecified lump in the left breast, unspecified quadrant: Secondary | ICD-10-CM

## 2022-01-27 DIAGNOSIS — R928 Other abnormal and inconclusive findings on diagnostic imaging of breast: Secondary | ICD-10-CM

## 2022-01-27 DIAGNOSIS — N6489 Other specified disorders of breast: Secondary | ICD-10-CM

## 2022-02-06 ENCOUNTER — Other Ambulatory Visit: Payer: Self-pay | Admitting: Nurse Practitioner

## 2022-02-06 DIAGNOSIS — D500 Iron deficiency anemia secondary to blood loss (chronic): Secondary | ICD-10-CM

## 2022-02-06 DIAGNOSIS — I10 Essential (primary) hypertension: Secondary | ICD-10-CM

## 2022-02-09 NOTE — Telephone Encounter (Signed)
Chart supports Rx Last OV: 12/2021 Next OV: not scheduled  

## 2022-05-05 ENCOUNTER — Other Ambulatory Visit: Payer: Self-pay | Admitting: Nurse Practitioner

## 2022-05-05 DIAGNOSIS — D500 Iron deficiency anemia secondary to blood loss (chronic): Secondary | ICD-10-CM

## 2022-05-05 NOTE — Telephone Encounter (Signed)
Chart supports Rx Last OV: 12/2021 Next OV: not scheduled, 30 day supply sent, pt needs f/u appt for further refills

## 2022-05-19 ENCOUNTER — Encounter: Payer: Self-pay | Admitting: Nurse Practitioner

## 2022-05-19 ENCOUNTER — Ambulatory Visit: Payer: BC Managed Care – PPO | Admitting: Nurse Practitioner

## 2022-05-19 VITALS — BP 150/92 | HR 74 | Temp 97.1°F | Ht 62.0 in | Wt 184.0 lb

## 2022-05-19 DIAGNOSIS — I10 Essential (primary) hypertension: Secondary | ICD-10-CM | POA: Diagnosis not present

## 2022-05-19 DIAGNOSIS — M5416 Radiculopathy, lumbar region: Secondary | ICD-10-CM | POA: Diagnosis not present

## 2022-05-19 DIAGNOSIS — T502X5A Adverse effect of carbonic-anhydrase inhibitors, benzothiadiazides and other diuretics, initial encounter: Secondary | ICD-10-CM | POA: Diagnosis not present

## 2022-05-19 DIAGNOSIS — E876 Hypokalemia: Secondary | ICD-10-CM

## 2022-05-19 LAB — BASIC METABOLIC PANEL
BUN: 12 mg/dL (ref 6–23)
CO2: 28 mEq/L (ref 19–32)
Calcium: 9.7 mg/dL (ref 8.4–10.5)
Chloride: 103 mEq/L (ref 96–112)
Creatinine, Ser: 0.67 mg/dL (ref 0.40–1.20)
GFR: 102.02 mL/min (ref >60.00)
Glucose, Bld: 82 mg/dL (ref 70–99)
Potassium: 3.3 mEq/L — ABNORMAL LOW (ref 3.5–5.1)
Sodium: 140 mEq/L (ref 135–145)

## 2022-05-19 MED ORDER — CYCLOBENZAPRINE HCL 5 MG PO TABS: 5.0000 mg | ORAL_TABLET | Freq: Every day | ORAL | 0 refills | Status: DC

## 2022-05-19 NOTE — Assessment & Plan Note (Signed)
Associated with right leg pain. Onset 2weeks ago Worse with prolonged sitting or in supine position No saddle paresthesia, no back injury, no hip pain  Advised about need for daily exercise Start back exercise. Start flexeril 5mg  at bedtime and ibuprofen 400-600mg  every8hrs as needed for pain Get lumbar spine x-ray and referral to PT if no improvement

## 2022-05-19 NOTE — Patient Instructions (Addendum)
Start back exercise. Start flexeril 5mg  at bedtime and ibuprofen 400-600mg  every8hrs as needed for pain Go to lab  Back Exercises The following exercises strengthen the muscles that help to support the trunk (torso) and back. They also help to keep the lower back flexible. Doing these exercises can help to prevent or lessen existing low back pain. If you have back pain or discomfort, try doing these exercises 2-3 times each day or as told by your health care provider. As your pain improves, do them once each day, but increase the number of times that you repeat the steps for each exercise (do more repetitions). To prevent the recurrence of back pain, continue to do these exercises once each day or as told by your health care provider. Do exercises exactly as told by your health care provider and adjust them as directed. It is normal to feel mild stretching, pulling, tightness, or discomfort as you do these exercises, but you should stop right away if you feel sudden pain or your pain gets worse. Exercises Single knee to chest Repeat these steps 3-5 times for each leg: Lie on your back on a firm bed or the floor with your legs extended. Bring one knee to your chest. Your other leg should stay extended and in contact with the floor. Hold your knee in place by grabbing your knee or thigh with both hands and hold. Pull on your knee until you feel a gentle stretch in your lower back or buttocks. Hold the stretch for 10-30 seconds. Slowly release and straighten your leg.  Pelvic tilt Repeat these steps 5-10 times: Lie on your back on a firm bed or the floor with your legs extended. Bend your knees so they are pointing toward the ceiling and your feet are flat on the floor. Tighten your lower abdominal muscles to press your lower back against the floor. This motion will tilt your pelvis so your tailbone points up toward the ceiling instead of pointing to your feet or the floor. With gentle tension  and even breathing, hold this position for 5-10 seconds.  Cat-cow Repeat these steps until your lower back becomes more flexible: Get into a hands-and-knees position on a firm bed or the floor. Keep your hands under your shoulders, and keep your knees under your hips. You may place padding under your knees for comfort. Let your head hang down toward your chest. Contract your abdominal muscles and point your tailbone toward the floor so your lower back becomes rounded like the back of a cat. Hold this position for 5 seconds. Slowly lift your head, let your abdominal muscles relax, and point your tailbone up toward the ceiling so your back forms a sagging arch like the back of a cow. Hold this position for 5 seconds.  Press-ups Repeat these steps 5-10 times: Lie on your abdomen (face-down) on a firm bed or the floor. Place your palms near your head, about shoulder-width apart. Keeping your back as relaxed as possible and keeping your hips on the floor, slowly straighten your arms to raise the top half of your body and lift your shoulders. Do not use your back muscles to raise your upper torso. You may adjust the placement of your hands to make yourself more comfortable. Hold this position for 5 seconds while you keep your back relaxed. Slowly return to lying flat on the floor.  Bridges Repeat these steps 10 times: Lie on your back on a firm bed or the floor. Bend your knees  so they are pointing toward the ceiling and your feet are flat on the floor. Your arms should be flat at your sides, next to your body. Tighten your buttocks muscles and lift your buttocks off the floor until your waist is at almost the same height as your knees. You should feel the muscles working in your buttocks and the back of your thighs. If you do not feel these muscles, slide your feet 1-2 inches (2.5-5 cm) farther away from your buttocks. Hold this position for 3-5 seconds. Slowly lower your hips to the starting  position, and allow your buttocks muscles to relax completely. If this exercise is too easy, try doing it with your arms crossed over your chest. Abdominal crunches Repeat these steps 5-10 times: Lie on your back on a firm bed or the floor with your legs extended. Bend your knees so they are pointing toward the ceiling and your feet are flat on the floor. Cross your arms over your chest. Tip your chin slightly toward your chest without bending your neck. Tighten your abdominal muscles and slowly raise your torso high enough to lift your shoulder blades a tiny bit off the floor. Avoid raising your torso higher than that because it can put too much stress on your lower back and does not help to strengthen your abdominal muscles. Slowly return to your starting position.  Back lifts Repeat these steps 5-10 times: Lie on your abdomen (face-down) with your arms at your sides, and rest your forehead on the floor. Tighten the muscles in your legs and your buttocks. Slowly lift your chest off the floor while you keep your hips pressed to the floor. Keep the back of your head in line with the curve in your back. Your eyes should be looking at the floor. Hold this position for 3-5 seconds. Slowly return to your starting position.  Contact a health care provider if: Your back pain or discomfort gets much worse when you do an exercise. Your worsening back pain or discomfort does not lessen within 2 hours after you exercise. If you have any of these problems, stop doing these exercises right away. Do not do them again unless your health care provider says that you can. Get help right away if: You develop sudden, severe back pain. If this happens, stop doing the exercises right away. Do not do them again unless your health care provider says that you can. This information is not intended to replace advice given to you by your health care provider. Make sure you discuss any questions you have with your  health care provider. Document Revised: 10/08/2020 Document Reviewed: 06/26/2020 Elsevier Patient Education  [place].

## 2022-05-19 NOTE — Progress Notes (Signed)
Established Patient Visit  Patient: Sokhna Christoph   DOB: 1970/08/28   52 y.o. Female  MRN: [id number] Visit Date: 05/19/2022  Subjective:    Chief Complaint  Patient presents with   Acute Visit    R leg pain a few weeks.    Leg Pain  The incident occurred more than 1 week ago. There was no injury mechanism. The pain is present in the right leg. The quality of the pain is described as aching. The pain is mild. The pain has been Intermittent since onset. Pertinent negatives include no inability to bear weight, loss of motion, loss of sensation, muscle weakness, numbness or tingling. She reports no foreign bodies present. Exacerbated by: sitting for more than 1hr and laying in supine position. She has tried nothing for the symptoms. Improvement on treatment: improves with repositioning.   Essential hypertension Home BP:134/65 Did not take BP med this morning BP Readings from Last 3 Encounters:  05/19/22 (!) 150/86  01/06/22 (!) 130/58  07/11/21 118/72    Advised about importance of med compliance. Maintain med dose Repeat bmp  Lumbar radiculopathy Associated with right leg pain. Onset 2weeks ago Worse with prolonged sitting or in supine position No saddle paresthesia, no back injury, no hip pain  Advised about need for daily exercise Start back exercise. Start flexeril 5mg  at bedtime and ibuprofen 400-600mg  every8hrs as needed for pain Get lumbar spine x-ray and referral to PT if no improvement  Reviewed medical, surgical, and social history today  Medications: Outpatient Medications Prior to Visit  Medication Sig   amLODipine (NORVASC) 10 MG tablet TAKE 1 TABLET BY MOUTH EVERYDAY AT BEDTIME   Ferrous Fumarate-Folic Acid (HEMATINIC/FOLIC ACID) 154-0 MG TABS TAKE 1 TABLET BY MOUTH EVERY DAY WITH BREAKFAST   linaclotide (LINZESS) 290 MCG CAPS capsule Take 1 capsule (290 mcg total) by mouth daily before breakfast. TAKE 1 CAPSULE (145 MCG TOTAL) BY MOUTH  DAILY BEFORE BREAKFAST.   triamterene-hydrochlorothiazide (MAXZIDE-25) 37.5-25 MG tablet Take 0.5 tablets by mouth daily.   valsartan (DIOVAN) 160 MG tablet TAKE 1 TABLET BY MOUTH EVERY DAY   witch hazel-glycerin (TUCKS) pad Apply 1 Application topically as needed for itching.   [DISCONTINUED] hydrocortisone (PROCTOSOL HC) 2.5 % rectal cream Place 1 Application rectally 2 (two) times daily. (Patient not taking: Reported on 05/19/2022)   [DISCONTINUED] potassium chloride SA (KLOR-CON M20) 20 MEQ tablet Take 1 tablet (20 mEq total) by mouth 2 (two) times daily. (Patient not taking: Reported on 01/06/2022)   No facility-administered medications prior to visit.   Reviewed past medical and social history.   ROS per HPI above      Objective:  BP (!) 150/92   Pulse 74   Temp (!) 97.1 F (36.2 C) (Temporal)   Ht 5\' 2"  (1.575 m)   Wt 184 lb (83.5 kg)   LMP 05/14/2022 (Exact Date)   SpO2 94%   BMI 33.65 kg/m      Physical Exam Vitals reviewed.  Cardiovascular:     Rate and Rhythm: Normal rate.     Pulses: Normal pulses.  Pulmonary:     Effort: Pulmonary effort is normal.  Musculoskeletal:     Lumbar back: Normal.     Right hip: Normal.     Left hip: Normal.     Right upper leg: Normal.     Left upper leg: Normal.     Right knee: Normal.  Left knee: Normal.     Right lower leg: Normal. No edema.     Left lower leg: Normal. No edema.     Right ankle: Normal.     Left ankle: Normal.     Right foot: Normal.     Left foot: Normal.  Neurological:     Mental Status: She is alert and oriented to person, place, and time.     No results found for any visits on 05/19/22.    Assessment & Plan:    Problem List Items Addressed This Visit       Cardiovascular and Mediastinum   Essential hypertension - Primary    Home BP:134/65 Did not take BP med this morning BP Readings from Last 3 Encounters:  05/19/22 (!) 150/86  01/06/22 (!) 130/58  07/11/21 118/72    Advised about  importance of med compliance. Maintain med dose Repeat bmp      Relevant Orders   Basic metabolic panel     Nervous and Auditory   Lumbar radiculopathy    Associated with right leg pain. Onset 2weeks ago Worse with prolonged sitting or in supine position No saddle paresthesia, no back injury, no hip pain  Advised about need for daily exercise Start back exercise. Start flexeril 5mg  at bedtime and ibuprofen 400-600mg  every8hrs as needed for pain Get lumbar spine x-ray and referral to PT if no improvement      Relevant Medications   cyclobenzaprine (FLEXERIL) 5 MG tablet   Return in about 3 months (around 08/18/2022) for CPE (fasting).     Wilfred Lacy, NP

## 2022-05-19 NOTE — Assessment & Plan Note (Addendum)
Home BP:134/65 Did not take BP med this morning BP Readings from Last 3 Encounters:  05/19/22 (!) 150/86  01/06/22 (!) 130/58  07/11/21 118/72    Advised about importance of med compliance. Maintain med dose Repeat bmp

## 2022-05-21 MED ORDER — POTASSIUM CHLORIDE CRYS ER 20 MEQ PO TBCR: 20.0000 meq | EXTENDED_RELEASE_TABLET | Freq: Every day | ORAL | 1 refills | Status: DC

## 2022-05-21 NOTE — Addendum Note (Signed)
Addended by: Leana Gamer on: 05/21/2022 03:48 PM   Modules accepted: Orders

## 2022-06-01 ENCOUNTER — Other Ambulatory Visit: Payer: Self-pay | Admitting: Nurse Practitioner

## 2022-06-01 DIAGNOSIS — D500 Iron deficiency anemia secondary to blood loss (chronic): Secondary | ICD-10-CM

## 2022-06-03 ENCOUNTER — Encounter: Payer: BC Managed Care – PPO | Admitting: Nurse Practitioner

## 2022-06-03 ENCOUNTER — Encounter: Payer: Self-pay | Admitting: Nurse Practitioner

## 2022-06-03 ENCOUNTER — Other Ambulatory Visit: Payer: Self-pay | Admitting: Nurse Practitioner

## 2022-06-03 DIAGNOSIS — D500 Iron deficiency anemia secondary to blood loss (chronic): Secondary | ICD-10-CM

## 2022-06-03 MED ORDER — HEMATINIC/FOLIC ACID 324-1 MG PO TABS: 1.0000 | ORAL_TABLET | ORAL | 0 refills | Status: DC

## 2022-06-04 NOTE — Progress Notes (Signed)
No seen errorThis encounter was created in error - please disregard.

## 2022-06-05 ENCOUNTER — Telehealth: Payer: Self-pay | Admitting: Nurse Practitioner

## 2022-06-05 NOTE — Telephone Encounter (Signed)
-----   Message from Lynnea Maizes, [place] sent at 06/05/2022  2:10 PM EST ----- Regarding: FW: BP reading Baldo Ash, Here are her Bp readings:   Wed- 134/65   Thurs- 140/70  - Today- 135/70   Khadija    ----- Message ----- From: Flossie Buffy, NP Sent: 06/05/2022   7:00 AM EST To: Lynnea Maizes, CMA Subject: BP reading                                     Ask what her BP readings are in last 1week.  Wilfred Lacy, NP

## 2022-06-29 ENCOUNTER — Ambulatory Visit
Admission: RE | Admit: 2022-06-29 | Discharge: 2022-06-29 | Disposition: A | Discharge status: Home / Self Care | Payer: BC Managed Care – PPO | Source: Ambulatory Visit | Attending: Nurse Practitioner | Admitting: Nurse Practitioner

## 2022-06-29 DIAGNOSIS — N6320 Unspecified lump in the left breast, unspecified quadrant: Secondary | ICD-10-CM

## 2022-06-29 DIAGNOSIS — N6012 Diffuse cystic mastopathy of left breast: Secondary | ICD-10-CM | POA: Diagnosis not present

## 2022-06-29 DIAGNOSIS — R928 Other abnormal and inconclusive findings on diagnostic imaging of breast: Secondary | ICD-10-CM | POA: Diagnosis not present

## 2022-08-19 ENCOUNTER — Ambulatory Visit (INDEPENDENT_AMBULATORY_CARE_PROVIDER_SITE_OTHER): Payer: BC Managed Care – PPO | Admitting: Nurse Practitioner

## 2022-08-19 ENCOUNTER — Encounter: Payer: Self-pay | Admitting: Nurse Practitioner

## 2022-08-19 VITALS — BP 120/82 | HR 63 | Temp 97.8°F | Resp 16 | Ht 62.0 in | Wt 184.2 lb

## 2022-08-19 DIAGNOSIS — Z23 Encounter for immunization: Secondary | ICD-10-CM

## 2022-08-19 DIAGNOSIS — K5904 Chronic idiopathic constipation: Secondary | ICD-10-CM | POA: Diagnosis not present

## 2022-08-19 DIAGNOSIS — Z136 Encounter for screening for cardiovascular disorders: Secondary | ICD-10-CM

## 2022-08-19 DIAGNOSIS — Z0001 Encounter for general adult medical examination with abnormal findings: Secondary | ICD-10-CM

## 2022-08-19 DIAGNOSIS — D75839 Thrombocytosis, unspecified: Secondary | ICD-10-CM | POA: Diagnosis not present

## 2022-08-19 DIAGNOSIS — I10 Essential (primary) hypertension: Secondary | ICD-10-CM | POA: Diagnosis not present

## 2022-08-19 DIAGNOSIS — D649 Anemia, unspecified: Secondary | ICD-10-CM | POA: Diagnosis not present

## 2022-08-19 DIAGNOSIS — Z13220 Encounter for screening for lipoid disorders: Secondary | ICD-10-CM | POA: Diagnosis not present

## 2022-08-19 DIAGNOSIS — F3281 Premenstrual dysphoric disorder: Secondary | ICD-10-CM

## 2022-08-19 LAB — LIPID PANEL
Cholesterol: 185 mg/dL (ref 0–200)
HDL: 74 mg/dL (ref >39.00)
LDL Cholesterol: 93 mg/dL (ref 0–99)
NonHDL: 110.64
Total CHOL/HDL Ratio: 2
Triglycerides: 89 mg/dL (ref 0.0–149.0)
VLDL: 17.8 mg/dL (ref 0.0–40.0)

## 2022-08-19 LAB — CBC
HCT: 34 % — ABNORMAL LOW (ref 36.0–46.0)
Hemoglobin: 11.6 g/dL — ABNORMAL LOW (ref 12.0–15.0)
MCHC: 34 g/dL (ref 30.0–36.0)
MCV: 92.8 fl (ref 78.0–100.0)
Platelets: 407 10*3/uL — ABNORMAL HIGH (ref 150.0–400.0)
RBC: 3.66 Mil/uL — ABNORMAL LOW (ref 3.87–5.11)
RDW: 15.1 % (ref 11.5–15.5)
WBC: 3.9 10*3/uL — ABNORMAL LOW (ref 4.0–10.5)

## 2022-08-19 LAB — COMPREHENSIVE METABOLIC PANEL
ALT: 18 U/L (ref 0–35)
AST: 26 U/L (ref 0–37)
Albumin: 4.2 g/dL (ref 3.5–5.2)
Alkaline Phosphatase: 45 U/L (ref 39–117)
BUN: 17 mg/dL (ref 6–23)
CO2: 27 mEq/L (ref 19–32)
Calcium: 10.2 mg/dL (ref 8.4–10.5)
Chloride: 105 mEq/L (ref 96–112)
Creatinine, Ser: 0.79 mg/dL (ref 0.40–1.20)
GFR: 87.16 mL/min (ref >60.00)
Glucose, Bld: 74 mg/dL (ref 70–99)
Potassium: 3.5 mEq/L (ref 3.5–5.1)
Sodium: 142 mEq/L (ref 135–145)
Total Bilirubin: 0.2 mg/dL (ref 0.2–1.2)
Total Protein: 6.5 g/dL (ref 6.0–8.3)

## 2022-08-19 LAB — IBC + FERRITIN
Ferritin: 38.5 ng/mL (ref 10.0–291.0)
Iron: 233 ug/dL — ABNORMAL HIGH (ref 42–145)
Saturation Ratios: 73 % — ABNORMAL HIGH (ref 20.0–50.0)
TIBC: 319.2 ug/dL (ref 250.0–450.0)
Transferrin: 228 mg/dL (ref 212.0–360.0)

## 2022-08-19 MED ORDER — VALSARTAN 160 MG PO TABS: 160.0000 mg | ORAL_TABLET | Freq: Every day | ORAL | 3 refills | Status: DC

## 2022-08-19 MED ORDER — AMLODIPINE BESYLATE 10 MG PO TABS
ORAL_TABLET | ORAL | 3 refills | Status: DC
Start: 2022-08-19 — End: 2023-05-19

## 2022-08-19 MED ORDER — LINACLOTIDE 290 MCG PO CAPS
290.0000 ug | ORAL_CAPSULE | Freq: Every day | ORAL | 3 refills | Status: DC
Start: 2022-08-19 — End: 2023-05-19

## 2022-08-19 NOTE — Assessment & Plan Note (Signed)
Repeat CBC and IBC

## 2022-08-19 NOTE — Assessment & Plan Note (Signed)
>>  ASSESSMENT AND PLAN FOR PMDD (PREMENSTRUAL DYSPHORIC DISORDER) WRITTEN ON 08/19/2022 12:04 PM BY Carman Essick LUM, NP  Labile mood with menstrual cycle. Decline use of SSRI or SNRI at this time

## 2022-08-19 NOTE — Progress Notes (Signed)
Complete physical exam  Patient: Jane Hampton   DOB: 08-15-1970   52 y.o. Female  MRN: [id number] Visit Date: 08/19/2022  Subjective:    Chief Complaint  Patient presents with   Annual Exam    Fasting- yes    Jane Hampton is a 52 y.o. female who presents today for a complete physical exam. She reports consuming a  general  diet.  No exercise regimen  She generally feels well. She reports sleeping well. She does not have additional problems to discuss today.  Vision:No Dental:Yes STD Screen:No  BP Readings from Last 3 Encounters:  08/19/22 120/82  06/05/22 136/68  06/03/22 (!) 140/93   Wt Readings from Last 3 Encounters:  08/19/22 184 lb 3.2 oz (83.6 kg)  06/03/22 184 lb 9.6 oz (83.7 kg)  05/19/22 184 lb (83.5 kg)   Most recent fall risk assessment:    06/03/2022    8:11 AM  Fall Risk   Falls in the past year? 0  Number falls in past yr: 0  Injury with Fall? 0   Depression screen:Yes - No Depression  Most recent depression screenings:    08/19/2022    9:02 AM 06/03/2022    8:12 AM  PHQ 2/9 Scores  PHQ - 2 Score 0 0   HPI  PMDD (premenstrual dysphoric disorder) Labile mood with menstrual cycle. Decline use of SSRI or SNRI at this time  Iron deficiency anemia due to chronic blood loss Repeat CBC and IBC  Essential hypertension BP at goal with amlodipine and valsartan BP Readings from Last 3 Encounters:  08/19/22 120/82  06/05/22 136/68  06/03/22 (!) 140/93    Maintain med doses   Past Medical History:  Diagnosis Date   Bleeding external hemorrhoids 01/06/2022   Hematochezia 09/06/2015   Hypertension    Migraines    Past Surgical History:  Procedure Laterality Date   CESAREAN SECTION     TONSILLECTOMY     TUBAL LIGATION     Social History   Socioeconomic History   Marital status: Married    Spouse name: Not on file   Number of children: 3   Years of education: Not on file   Highest education level: Not on file  Occupational  History   Occupation: certified nursing assistant  Tobacco Use   Smoking status: Former    Years: 15    Types: Cigarettes    Quit date: 10/02/2000    Years since quitting: 21.8   Smokeless tobacco: Never  Vaping Use   Vaping Use: Never used  Substance and Sexual Activity   Alcohol use: No    Alcohol/week: 0.0 standard drinks of alcohol   Drug use: No   Sexual activity: Yes    Birth control/protection: Surgical    Comment: S/p tubal ligation  Other Topics Concern   Not on file  Social History Narrative   Not on file   Social Determinants of Health   Financial Resource Strain: Low Risk  (08/19/2022)   Overall Financial Resource Strain (CARDIA)    Difficulty of Paying Living Expenses: Not hard at all  Food Insecurity: No Food Insecurity (08/19/2022)   Hunger Vital Sign    Worried About Running Out of Food in the Last Year: Never true    Ran Out of Food in the Last Year: Never true  Transportation Needs: No Transportation Needs (08/19/2022)   PRAPARE - Administrator, Civil Service (Medical): No    Lack of Transportation (Non-Medical): No  Physical Activity: Insufficiently Active (08/19/2022)   Exercise Vital Sign    Days of Exercise per Week: 3 days    Minutes of Exercise per Session: 30 min  Stress: No Stress Concern Present (08/19/2022)   [employer] of Occupational Health - Occupational Stress Questionnaire    Feeling of Stress : Only a little  Social Connections: Moderately Integrated (08/19/2022)   Social Connection and Isolation Panel [NHANES]    Frequency of Communication with Friends and Family: More than three times a week    Frequency of Social Gatherings with Friends and Family: Once a week    Attends Religious Services: 1 to 4 times per year    Active Member of [employer] or Organizations: No    Attends Banker Meetings: Never    Marital Status: Married  Catering manager Violence: Not At Risk (08/19/2022)   Humiliation, Afraid, Rape, and  Kick questionnaire    Fear of Current or Ex-Partner: No    Emotionally Abused: No    Physically Abused: No    Sexually Abused: No   Family Status  Relation Name Status   Mother  (Not Specified)   Father  (Not Specified)   Sister  (Not Specified)   MGM  (Not Specified)   Neg Hx  (Not Specified)   Family History  Problem Relation Age of Onset   Hypertension Mother    Heart disease Mother    Diabetes Mother    Hypertension Father    Anuerysm Father        brain   Anuerysm Sister        brain   Diabetes Maternal Grandmother    Breast cancer Neg Hx    Allergies  Allergen Reactions   Lisinopril Swelling    Severe upper lip swelling    Patient Care Team: Gypsy Kellogg, Bonna Gains, NP as PCP - General (Internal Medicine)   Medications: Outpatient Medications Prior to Visit  Medication Sig   cyclobenzaprine (FLEXERIL) 5 MG tablet Take 1 tablet (5 mg total) by mouth at bedtime.   Ferrous Fumarate-Folic Acid (HEMATINIC/FOLIC ACID) 324-1 MG TABS Take 1 tablet by mouth every other day.   potassium chloride SA (KLOR-CON M) 20 MEQ tablet Take 1 tablet (20 mEq total) by mouth daily.   witch hazel-glycerin (TUCKS) pad Apply 1 Application topically as needed for itching.   [DISCONTINUED] amLODipine (NORVASC) 10 MG tablet TAKE 1 TABLET BY MOUTH EVERYDAY AT BEDTIME   [DISCONTINUED] linaclotide (LINZESS) 290 MCG CAPS capsule Take 1 capsule (290 mcg total) by mouth daily before breakfast. TAKE 1 CAPSULE (145 MCG TOTAL) BY MOUTH DAILY BEFORE BREAKFAST.   [DISCONTINUED] valsartan (DIOVAN) 160 MG tablet TAKE 1 TABLET BY MOUTH EVERY DAY   No facility-administered medications prior to visit.    Review of Systems  Constitutional:  Negative for activity change, appetite change and unexpected weight change.  Respiratory: Negative.    Cardiovascular: Negative.   Gastrointestinal: Negative.   Endocrine: Negative for cold intolerance and heat intolerance.  Genitourinary: Negative.   Musculoskeletal:  Negative.   Skin: Negative.   Neurological: Negative.   Hematological: Negative.   Psychiatric/Behavioral:  Negative for behavioral problems, decreased concentration, dysphoric mood, hallucinations, self-injury, sleep disturbance and suicidal ideas. The patient is not nervous/anxious.         Objective:  BP 120/82 (BP Location: Right Arm, Patient Position: Sitting, Cuff Size: Large)   Pulse 63   Temp 97.8 F (36.6 C) (Temporal)   Resp 16   Ht 5\' 2"  (  1.575 m)   Wt 184 lb 3.2 oz (83.6 kg)   LMP 08/05/2022 (Approximate)   SpO2 99%   BMI 33.69 kg/m     Physical Exam Vitals and nursing note reviewed.  Constitutional:      General: She is not in acute distress. HENT:     Right Ear: Tympanic membrane, ear canal and external ear normal.     Left Ear: Tympanic membrane, ear canal and external ear normal.     Nose: Nose normal.  Eyes:     Extraocular Movements: Extraocular movements intact.     Conjunctiva/sclera: Conjunctivae normal.     Pupils: Pupils are equal, round, and reactive to light.  Neck:     Thyroid: No thyroid mass, thyromegaly or thyroid tenderness.  Cardiovascular:     Rate and Rhythm: Normal rate and regular rhythm.     Pulses: Normal pulses.     Heart sounds: Normal heart sounds.  Pulmonary:     Effort: Pulmonary effort is normal.     Breath sounds: Normal breath sounds.  Abdominal:     General: Bowel sounds are normal.     Palpations: Abdomen is soft.  Musculoskeletal:        General: Normal range of motion.     Cervical back: Normal range of motion and neck supple.     Right lower leg: No edema.     Left lower leg: No edema.  Lymphadenopathy:     Cervical: No cervical adenopathy.  Skin:    General: Skin is warm and dry.  Neurological:     Mental Status: She is alert and oriented to person, place, and time.     Cranial Nerves: No cranial nerve deficit.  Psychiatric:        Mood and Affect: Mood normal.        Behavior: Behavior normal.         Thought Content: Thought content normal.      No results found for any visits on 08/19/22.    Assessment & Plan:    Routine Health Maintenance and Physical Exam  Immunization History  Administered Date(s) Administered   Influenza,inj,Quad PF,6+ Mos 01/10/2016, 01/05/2017, 02/28/2019, 01/27/2021, 01/06/2022   Influenza-Unspecified 02/21/2020   PFIZER(Purple Top)SARS-COV-2 Vaccination 07/19/2019, 08/09/2019, 03/28/2020   PPD Test 05/22/2015, 06/17/2016   Tdap 04/24/2014   Zoster Recombinat (Shingrix) 08/19/2022    Health Maintenance  Topic Date Due   COVID-19 Vaccine (4 - 2023-24 season) 09/04/2022 (Originally 12/26/2021)   Zoster Vaccines- Shingrix (2 of 2) 10/14/2022   INFLUENZA VACCINE  11/26/2022   PAP SMEAR-Modifier  03/08/2023   DTaP/Tdap/Td (2 - Td or Tdap) 04/24/2024   MAMMOGRAM  06/28/2024   COLONOSCOPY (Pts 45-37yrs Insurance coverage will need to be confirmed)  10/16/2025   HIV Screening  Completed   HPV VACCINES  Aged Out   Hepatitis C Screening  Discontinued    Discussed health benefits of physical activity, and encouraged her to engage in regular exercise appropriate for her age and condition.  Problem List Items Addressed This Visit       Cardiovascular and Mediastinum   Essential hypertension    BP at goal with amlodipine and valsartan BP Readings from Last 3 Encounters:  08/19/22 120/82  06/05/22 136/68  06/03/22 (!) 140/93    Maintain med doses       Relevant Medications   amLODipine (NORVASC) 10 MG tablet   valsartan (DIOVAN) 160 MG tablet     Digestive   Chronic idiopathic constipation  Relevant Medications   linaclotide (LINZESS) 290 MCG CAPS capsule     Other   Iron deficiency anemia due to chronic blood loss    Repeat CBC and IBC      Relevant Orders   CBC   IBC + Ferritin   PMDD (premenstrual dysphoric disorder)    Labile mood with menstrual cycle. Decline use of SSRI or SNRI at this time      Other Visit Diagnoses      Encounter for preventative adult health care exam with abnormal findings    -  Primary   Relevant Orders   Lipid panel   Encounter for lipid screening for cardiovascular disease       Relevant Orders   Comprehensive metabolic panel   Lipid panel   Immunization due       Relevant Orders   Zoster Recombinant (Shingrix )      Return in about 6 months (around 02/18/2023) for HTN.     Alysia Penna, NP

## 2022-08-19 NOTE — Assessment & Plan Note (Addendum)
BP at goal with amlodipine and valsartan BP Readings from Last 3 Encounters:  08/19/22 120/82  06/05/22 136/68  06/03/22 (!) 140/93    Maintain med doses

## 2022-08-19 NOTE — Patient Instructions (Signed)
Go to lab Continue Heart healthy diet and daily exercise. Maintain current medications. Schedule nurse visit for 2nd zoster vaccine in 2-69months  Preventive Care 55-51 Years Old, Female Preventive care refers to lifestyle choices and visits with your health care provider that can promote health and wellness. Preventive care visits are also called wellness exams. What can I expect for my preventive care visit? Counseling Your health care provider may ask you questions about your: Medical history, including: Past medical problems. Family medical history. Pregnancy history. Current health, including: Menstrual cycle. Method of birth control. Emotional well-being. Home life and relationship well-being. Sexual activity and sexual health. Lifestyle, including: Alcohol, nicotine or tobacco, and drug use. Access to firearms. Diet, exercise, and sleep habits. Work and work Astronomer. Sunscreen use. Safety issues such as seatbelt and bike helmet use. Physical exam Your health care provider will check your: Height and weight. These may be used to calculate your BMI (body mass index). BMI is a measurement that tells if you are at a healthy weight. Waist circumference. This measures the distance around your waistline. This measurement also tells if you are at a healthy weight and may help predict your risk of certain diseases, such as type 2 diabetes and high blood pressure. Heart rate and blood pressure. Body temperature. Skin for abnormal spots. What immunizations do I need?  Vaccines are usually given at various ages, according to a schedule. Your health care provider will recommend vaccines for you based on your age, medical history, and lifestyle or other factors, such as travel or where you work. What tests do I need? Screening Your health care provider may recommend screening tests for certain conditions. This may include: Lipid and cholesterol levels. Diabetes screening. This is  done by checking your blood sugar (glucose) after you have not eaten for a while (fasting). Pelvic exam and Pap test. Hepatitis B test. Hepatitis C test. HIV (human immunodeficiency virus) test. STI (sexually transmitted infection) testing, if you are at risk. Lung cancer screening. Colorectal cancer screening. Mammogram. Talk with your health care provider about when you should start having regular mammograms. This may depend on whether you have a family history of breast cancer. BRCA-related cancer screening. This may be done if you have a family history of breast, ovarian, tubal, or peritoneal cancers. Bone density scan. This is done to screen for osteoporosis. Talk with your health care provider about your test results, treatment options, and if necessary, the need for more tests. Follow these instructions at home: Eating and drinking  Eat a diet that includes fresh fruits and vegetables, whole grains, lean protein, and low-fat dairy products. Take vitamin and mineral supplements as recommended by your health care provider. Do not drink alcohol if: Your health care provider tells you not to drink. You are pregnant, may be pregnant, or are planning to become pregnant. If you drink alcohol: Limit how much you have to 0-1 drink a day. Know how much alcohol is in your drink. In the [place] one drink equals one 12 oz bottle of beer (355 mL), one 5 oz glass of wine (148 mL), or one 1 oz glass of hard liquor (44 mL). Lifestyle Brush your teeth every morning and night with fluoride toothpaste. Floss one time each day. Exercise for at least 30 minutes 5 or more days each week. Do not use any products that contain nicotine or tobacco. These products include cigarettes, chewing tobacco, and vaping devices, such as e-cigarettes. If you need help quitting, ask your health  care provider. Do not use drugs. If you are sexually active, practice safe sex. Use a condom or other form of protection to  prevent STIs. If you do not wish to become pregnant, use a form of birth control. If you plan to become pregnant, see your health care provider for a prepregnancy visit. Take aspirin only as told by your health care provider. Make sure that you understand how much to take and what form to take. Work with your health care provider to find out whether it is safe and beneficial for you to take aspirin daily. Find healthy ways to manage stress, such as: Meditation, yoga, or listening to music. Journaling. Talking to a trusted person. Spending time with friends and family. Minimize exposure to UV radiation to reduce your risk of skin cancer. Safety Always wear your seat belt while driving or riding in a vehicle. Do not drive: If you have been drinking alcohol. Do not ride with someone who has been drinking. When you are tired or distracted. While texting. If you have been using any mind-altering substances or drugs. Wear a helmet and other protective equipment during sports activities. If you have firearms in your house, make sure you follow all gun safety procedures. Seek help if you have been physically or sexually abused. What's next? Visit your health care provider once a year for an annual wellness visit. Ask your health care provider how often you should have your eyes and teeth checked. Stay up to date on all vaccines. This information is not intended to replace advice given to you by your health care provider. Make sure you discuss any questions you have with your health care provider. Document Revised: 10/09/2020 Document Reviewed: 10/09/2020 Elsevier Patient Education  2023 [employer].

## 2022-08-19 NOTE — Assessment & Plan Note (Signed)
Labile mood with menstrual cycle. Decline use of SSRI or SNRI at this time

## 2022-08-19 NOTE — Addendum Note (Signed)
Addended by: Michaela Corner on: 08/19/2022 03:16 PM   Modules accepted: Orders

## 2022-09-13 NOTE — Progress Notes (Signed)
[facility] Telephone:([phone]) [phone]   Fax:([phone]) [phone]  CONSULT NOTE  REFERRING PHYSICIAN: Alysia Penna NP  REASON FOR CONSULTATION:  Thrombocytosis   HPI Jane Hampton is a 52 y.o. female with a past medical history significant for internal hemorrhoids, hypertension, lumbar radiculopathy, major depressive disorder, and iron deficiency is referred to the clinic for mild thrombocytosis and iron deficiency.  The patient had an annual physical on 08/19/2022.  Her labs that day showed mild leukocytopenia with a total white blood cell count of 3.9, mildly low hemoglobin at 11.6, mildly elevated platelet count at 407K.  Her iron was elevated to 233, her saturation was elevated 73, and her ferritin was 38.5.  Her transferrin was normal at 228.  She was told to discontinue her prescription iron supplement. She had been taking this for about 1 year. OTC iron supplements made her feel sick. She tolerates prescription iron supplements. She is referred to the clinic regarding these findings.  The patient also has a history of iron deficiency anemia. The oldest records I have are from 2013. She had anemia since that time on and off. I can see she had significant anemia in July 2016 secondary to IDA. She has struggled with hemorrhoids and had a colonoscopy by Dr. Myrtie Neither in 2017 for this which showed moderate sized internal hemorrhoids.   Her anemia has never gotten severe where she is needed an iron transfusion or blood transfusion.    Regarding the hemorrhoids, she has had hemorrhoid banding performed in the past without any success.  Due to the persistent hemorrhoid bleeding, she saw surgeon a few months ago.  She states that they offered her surgery but she declined at that time.  She is contemplating proceeding with the surgery since she continues to have hemorrhoid bleeding in the stool and in the toilet bowl after bowel movements.  Has a history of IBS constipation subtype for  which she takes Linzess.  She denies any significant constipation at this time.  Regarding the thrombocytosis, no other prior history of thrombocytosis from reviewing her labs since 2013.   Today, the patient is feeling fine without any concerning complaints but she was nevous for her appointment due to it being at a Cancer Center. She denies any fever or lymphadenopathy. She denies any nausea, vomiting, diarrhea, constipation, abdominal pain, or abdominal fullness. She denies any other abnormal bleeding or bruising, specifically, she denies hematuria, epistaxis, hematemesis, or gingival bleeding.  She still has monthly menstrual cycles lasting 1 week.  She states she uses 3 pads/tampons per day on the first 3 days of her cycle but otherwise her cycles are not heavy.  She denies any clots.  She denies any spotting in between her menstrual cycles. She denies any recent infections. She denies any rashes. Denies allergies. She denies any unusual headaches, lightheadedness, syncope, or visual changes.  She does not eat a lot of red meat due to patient preference. She prefers [place]. She denies history of DVT, MI, PE, or CVA. Denies splenectomy.   She denies any malignancies in her family.  Her mother had diabetes and heart attack.  Her father had hypertension and aneurysm.  She had an older sister who passed away secondary to hypertension and aneurysm.  She is married and has 3 children.  She works as a Lawyer.  Denies any smoking, drug, or alcohol use.   HPI  Past Medical History:  Diagnosis Date   Bleeding external hemorrhoids 01/06/2022   Hematochezia 09/06/2015  Hypertension    Migraines     Past Surgical History:  Procedure Laterality Date   CESAREAN SECTION     TONSILLECTOMY     TUBAL LIGATION      Family History  Problem Relation Age of Onset   Hypertension Mother    Heart disease Mother    Diabetes Mother    Hypertension Father    Anuerysm Father        brain   Anuerysm Sister         brain   Diabetes Maternal Grandmother    Breast cancer Neg Hx     Social History Social History   Tobacco Use   Smoking status: Former    Years: 15    Types: Cigarettes    Quit date: 10/02/2000    Years since quitting: 21.9   Smokeless tobacco: Never  Vaping Use   Vaping Use: Never used  Substance Use Topics   Alcohol use: No    Alcohol/week: 0.0 standard drinks of alcohol   Drug use: No    Allergies  Allergen Reactions   Lisinopril Swelling    Severe upper lip swelling    Current Outpatient Medications  Medication Sig Dispense Refill   amLODipine (NORVASC) 10 MG tablet TAKE 1 TABLET BY MOUTH EVERYDAY AT BEDTIME 90 tablet 3   cyclobenzaprine (FLEXERIL) 5 MG tablet Take 1 tablet (5 mg total) by mouth at bedtime. 14 tablet 0   Ferrous Fumarate-Folic Acid (HEMATINIC/FOLIC ACID) 324-1 MG TABS Take 1 tablet by mouth every other day. 90 tablet 0   linaclotide (LINZESS) 290 MCG CAPS capsule Take 1 capsule (290 mcg total) by mouth daily before breakfast. 90 capsule 3   potassium chloride SA (KLOR-CON M) 20 MEQ tablet Take 1 tablet (20 mEq total) by mouth daily. 90 tablet 1   valsartan (DIOVAN) 160 MG tablet Take 1 tablet (160 mg total) by mouth daily. 90 tablet 3   witch hazel-glycerin (TUCKS) pad Apply 1 Application topically as needed for itching. 40 each 12   No current facility-administered medications for this visit.    REVIEW OF SYSTEMS:   Review of Systems  Constitutional: Negative for appetite change, chills, fatigue, fever and unexpected weight change.  HENT: Negative for mouth sores, nosebleeds, sore throat and trouble swallowing.   Eyes: Negative for eye problems and icterus.  Respiratory: Negative for cough, hemoptysis, shortness of breath and wheezing.   Cardiovascular: Negative for chest pain and leg swelling.  Gastrointestinal: Negative for abdominal pain, constipation, diarrhea, nausea and vomiting.  Genitourinary: Negative for bladder incontinence,  difficulty urinating, dysuria, frequency and hematuria.   Musculoskeletal: Negative for back pain, gait problem, neck pain and neck stiffness.  Skin: Negative for itching and rash.  Neurological: Negative for dizziness, extremity weakness, gait problem, headaches, light-headedness and seizures.  Hematological: Negative for adenopathy. Does not bruise/bleed easily.  Psychiatric/Behavioral: Negative for confusion, depression and sleep disturbance. The patient is not nervous/anxious.     PHYSICAL EXAMINATION:  Blood pressure (!) 159/90, pulse 61, temperature 97.8 F (36.6 C), temperature source Oral, resp. rate 13, weight 182 lb 14.4 oz (83 kg), last menstrual period 08/05/2022.  ECOG PERFORMANCE STATUS: 0  Physical Exam  Constitutional: Oriented to person, place, and time and well-developed, well-nourished, and in no distress.  HENT:  Head: Normocephalic and atraumatic.  Mouth/Throat: Oropharynx is clear and moist. No oropharyngeal exudate.  Eyes: Conjunctivae are normal. Right eye exhibits no discharge. Left eye exhibits no discharge. No scleral icterus.  Neck: Normal range  of motion. Neck supple.  Cardiovascular: Normal rate, regular rhythm, normal heart sounds and intact distal pulses.   Pulmonary/Chest: Effort normal and breath sounds normal. No respiratory distress. No wheezes. No rales.  Abdominal: Soft. Bowel sounds are normal. Exhibits no distension and no mass. There is no tenderness.  Musculoskeletal: Normal range of motion. Exhibits no edema.  Lymphadenopathy:    No cervical adenopathy.  Neurological: Alert and oriented to person, place, and time. Exhibits normal muscle tone. Gait normal. Coordination normal.  Skin: Skin is warm and dry. No rash noted. Not diaphoretic. No erythema. No pallor.  Psychiatric: Mood, memory and judgment normal.  Vitals reviewed.  LABORATORY DATA: Lab Results  Component Value Date   WBC 4.2 09/16/2022   HGB 12.0 09/16/2022   HCT 34.5 (L)  09/16/2022   MCV 87.8 09/16/2022   PLT 263 09/16/2022      Chemistry      Component Value Date/Time   NA 140 09/16/2022 1237   K 3.3 (L) 09/16/2022 1237   CL 106 09/16/2022 1237   CO2 31 09/16/2022 1237   BUN 9 09/16/2022 1237   CREATININE 0.73 09/16/2022 1237      Component Value Date/Time   CALCIUM 8.9 09/16/2022 1237   ALKPHOS 40 09/16/2022 1237   AST 21 09/16/2022 1237   ALT 16 09/16/2022 1237   BILITOT 0.3 09/16/2022 1237       RADIOGRAPHIC STUDIES: No results found.  ASSESSMENT: This is a very pleasant 52 year old African American female referred to the clinic for thrombocytosis   The patient had several lab studies today including CBC, CMP, iron studies, b12, folate, SPEP, LDH, and ferritin her labs today show normal white blood cell count at 4.2, normal hemoglobin at 12.0, and normal platelet count at 263 K her iron studies are pending.   The patient was seen with Dr. Arbutus Ped today who feels that the thrombocytosis is reactive secondary to her anemia.  JAK2 mutation testing is likely not necessary at this time given her normal platelet count today.   Recommended that the patient take an iron supplement every other day with vitamin C as her elevated platelet count last month was still likely secondary to anemia and she has some ongoing concerns with rectal bleeding from hemorrhoids at this time which will cause worsening anemia.   She is going to call the surgeons office back as she is now contemplating surgery. She has had hemorrhoid banding int he past without improvement. She has rectal bleeding with bowel movements.    No follow up visit is needed at this time but we would be happy to see her back on an as needed basis if she developed worsening anemia in the future requiring IV iron or if she had new or worsening thrombocytosis. Let her know if she is anemic in the future, it would not be uncommon to have associated reactive mild elevations in her platelet count.    The patient voices understanding of current disease status and treatment options and is in agreement with the current care plan.  All questions were answered. The patient knows to call the clinic with any problems, questions or concerns. We can certainly see the patient much sooner if necessary.  Thank you so much for allowing me to participate in the care of Cassadie Pettee. I will continue to follow up the patient with you and assist in her care.  Disclaimer: This note was dictated with voice recognition software. Similar sounding words can inadvertently be transcribed  and may not be corrected upon review.   Torian Quintero L Bruin Bolger Sep 16, 2022, 1:34 PM  ADDENDUM: Hematology/Oncology Attending: I had a face-to-face encounter with the patient today.  I reviewed her record, lab and recommended her care plan.  This is a very pleasant 52 years old African-American female with past medical history significant for hypertension, major depressive disorder, iron deficiency, lumbar radiculopathy as well as internal hemorrhoids.  The patient was seen by her primary care provider for routine annual evaluation on 08/19/2022 and her blood work showed mild leukocytopenia as well as mild anemia and elevated platelets count of 407,000.  Her iron study at that time was unremarkable. The patient has been on oral iron tablet but discontinued recently. When seen today she is feeling fine with no concerning complaints. We ordered several studies today including repeat CBC that showed normal total white blood count of 4.2, hemoglobin was normal at 12.0 and hematocrit 34.5% with platelets count in the normal range of 263,000. Iron studies showed normal serum iron with normal TIBC but iron saturation was low at 9% and ferritin level was low normal of 18. The patient had normal serum folate and vitamin B12. I explained to the patient that her thrombocytosis was likely reactive in nature secondary to iron  deficiency. I recommended for the patient to resume her treatment with oral iron tablets at least every other day with vitamin C. I do not see a need for any additional investigation at this point and the patient can continue her routine follow-up visit and management by her primary care physician. She was advised to call immediately if she has any other concerning symptoms in the interval. The total time spent in the appointment was 60 minutes. Disclaimer: This note was dictated with voice recognition software. Similar sounding words can inadvertently be transcribed and may be missed upon review. Lajuana Matte, MD

## 2022-09-16 ENCOUNTER — Inpatient Hospital Stay: Payer: Medicaid Other | Attending: Physician Assistant | Admitting: Physician Assistant

## 2022-09-16 ENCOUNTER — Inpatient Hospital Stay: Payer: Medicaid Other

## 2022-09-16 ENCOUNTER — Other Ambulatory Visit: Payer: Self-pay

## 2022-09-16 ENCOUNTER — Other Ambulatory Visit: Payer: Self-pay | Admitting: Physician Assistant

## 2022-09-16 VITALS — BP 159/90 | HR 61 | Temp 97.8°F | Resp 13 | Wt 182.9 lb

## 2022-09-16 DIAGNOSIS — Z8719 Personal history of other diseases of the digestive system: Secondary | ICD-10-CM | POA: Insufficient documentation

## 2022-09-16 DIAGNOSIS — Z888 Allergy status to other drugs, medicaments and biological substances status: Secondary | ICD-10-CM | POA: Insufficient documentation

## 2022-09-16 DIAGNOSIS — Z8249 Family history of ischemic heart disease and other diseases of the circulatory system: Secondary | ICD-10-CM | POA: Diagnosis not present

## 2022-09-16 DIAGNOSIS — Z87891 Personal history of nicotine dependence: Secondary | ICD-10-CM

## 2022-09-16 DIAGNOSIS — D72819 Decreased white blood cell count, unspecified: Secondary | ICD-10-CM | POA: Diagnosis not present

## 2022-09-16 DIAGNOSIS — Z8049 Family history of malignant neoplasm of other genital organs: Secondary | ICD-10-CM | POA: Diagnosis not present

## 2022-09-16 DIAGNOSIS — K589 Irritable bowel syndrome without diarrhea: Secondary | ICD-10-CM | POA: Insufficient documentation

## 2022-09-16 DIAGNOSIS — Z833 Family history of diabetes mellitus: Secondary | ICD-10-CM | POA: Diagnosis not present

## 2022-09-16 DIAGNOSIS — F32A Depression, unspecified: Secondary | ICD-10-CM | POA: Diagnosis not present

## 2022-09-16 DIAGNOSIS — D500 Iron deficiency anemia secondary to blood loss (chronic): Secondary | ICD-10-CM

## 2022-09-16 DIAGNOSIS — D649 Anemia, unspecified: Secondary | ICD-10-CM | POA: Diagnosis not present

## 2022-09-16 DIAGNOSIS — D509 Iron deficiency anemia, unspecified: Secondary | ICD-10-CM

## 2022-09-16 DIAGNOSIS — I10 Essential (primary) hypertension: Secondary | ICD-10-CM | POA: Diagnosis not present

## 2022-09-16 DIAGNOSIS — D75839 Thrombocytosis, unspecified: Secondary | ICD-10-CM

## 2022-09-16 DIAGNOSIS — K625 Hemorrhage of anus and rectum: Secondary | ICD-10-CM | POA: Diagnosis not present

## 2022-09-16 DIAGNOSIS — E611 Iron deficiency: Secondary | ICD-10-CM | POA: Diagnosis not present

## 2022-09-16 DIAGNOSIS — Z79899 Other long term (current) drug therapy: Secondary | ICD-10-CM | POA: Insufficient documentation

## 2022-09-16 DIAGNOSIS — D75838 Other thrombocytosis: Secondary | ICD-10-CM | POA: Insufficient documentation

## 2022-09-16 LAB — CBC WITH DIFFERENTIAL (CANCER CENTER ONLY)
Abs Immature Granulocytes: 0.01 10*3/uL (ref 0.00–0.07)
Basophils Absolute: 0.1 10*3/uL (ref 0.0–0.1)
Basophils Relative: 1 %
Eosinophils Absolute: 0.1 10*3/uL (ref 0.0–0.5)
Eosinophils Relative: 3 %
HCT: 34.5 % — ABNORMAL LOW (ref 36.0–46.0)
Hemoglobin: 12 g/dL (ref 12.0–15.0)
Immature Granulocytes: 0 %
Lymphocytes Relative: 31 %
Lymphs Abs: 1.3 10*3/uL (ref 0.7–4.0)
MCH: 30.5 pg (ref 26.0–34.0)
MCHC: 34.8 g/dL (ref 30.0–36.0)
MCV: 87.8 fL (ref 80.0–100.0)
Monocytes Absolute: 0.5 10*3/uL (ref 0.1–1.0)
Monocytes Relative: 11 %
Neutro Abs: 2.2 10*3/uL (ref 1.7–7.7)
Neutrophils Relative %: 54 %
Platelet Count: 263 10*3/uL (ref 150–400)
RBC: 3.93 MIL/uL (ref 3.87–5.11)
RDW: 12.7 % (ref 11.5–15.5)
WBC Count: 4.2 10*3/uL (ref 4.0–10.5)
nRBC: 0 % (ref 0.0–0.2)

## 2022-09-16 LAB — CMP (CANCER CENTER ONLY)
ALT: 16 U/L (ref 0–44)
AST: 21 U/L (ref 15–41)
Albumin: 3.9 g/dL (ref 3.5–5.0)
Alkaline Phosphatase: 40 U/L (ref 38–126)
Anion gap: 3 — ABNORMAL LOW (ref 5–15)
BUN: 9 mg/dL (ref 6–20)
CO2: 31 mmol/L (ref 22–32)
Calcium: 8.9 mg/dL (ref 8.9–10.3)
Chloride: 106 mmol/L (ref 98–111)
Creatinine: 0.73 mg/dL (ref 0.44–1.00)
GFR, Estimated: >60 mL/min (ref >60)
Glucose, Bld: 73 mg/dL (ref 70–99)
Potassium: 3.3 mmol/L — ABNORMAL LOW (ref 3.5–5.1)
Sodium: 140 mmol/L (ref 135–145)
Total Bilirubin: 0.3 mg/dL (ref 0.3–1.2)
Total Protein: 6.2 g/dL — ABNORMAL LOW (ref 6.5–8.1)

## 2022-09-16 LAB — FERRITIN: Ferritin: 18 ng/mL (ref 11–307)

## 2022-09-16 LAB — IRON AND IRON BINDING CAPACITY (CC-WL,HP ONLY)
Iron: 29 ug/dL (ref 28–170)
Saturation Ratios: 9 % — ABNORMAL LOW (ref 10.4–31.8)
TIBC: 326 ug/dL (ref 250–450)
UIBC: 297 ug/dL (ref 148–442)

## 2022-09-16 LAB — FOLATE: Folate: 21.9 ng/mL (ref >5.9)

## 2022-09-16 LAB — VITAMIN B12: Vitamin B-12: 515 pg/mL (ref 180–914)

## 2022-09-17 ENCOUNTER — Encounter: Payer: Self-pay | Admitting: Physician Assistant

## 2022-11-29 ENCOUNTER — Other Ambulatory Visit: Payer: Self-pay | Admitting: Nurse Practitioner

## 2022-11-29 DIAGNOSIS — D500 Iron deficiency anemia secondary to blood loss (chronic): Secondary | ICD-10-CM

## 2023-05-19 ENCOUNTER — Ambulatory Visit: Payer: Medicaid Other | Admitting: Nurse Practitioner

## 2023-05-19 ENCOUNTER — Encounter: Payer: Self-pay | Admitting: Nurse Practitioner

## 2023-05-19 ENCOUNTER — Other Ambulatory Visit: Payer: Self-pay | Admitting: Nurse Practitioner

## 2023-05-19 VITALS — BP 134/84 | HR 70 | Temp 97.7°F | Resp 18 | Wt 167.0 lb

## 2023-05-19 DIAGNOSIS — R739 Hyperglycemia, unspecified: Secondary | ICD-10-CM

## 2023-05-19 DIAGNOSIS — T502X5A Adverse effect of carbonic-anhydrase inhibitors, benzothiadiazides and other diuretics, initial encounter: Secondary | ICD-10-CM

## 2023-05-19 DIAGNOSIS — Z23 Encounter for immunization: Secondary | ICD-10-CM | POA: Diagnosis not present

## 2023-05-19 DIAGNOSIS — K5904 Chronic idiopathic constipation: Secondary | ICD-10-CM

## 2023-05-19 DIAGNOSIS — I10 Essential (primary) hypertension: Secondary | ICD-10-CM

## 2023-05-19 LAB — RENAL FUNCTION PANEL
Albumin: 4.2 g/dL (ref 3.5–5.2)
BUN: 14 mg/dL (ref 6–23)
CO2: 29 meq/L (ref 19–32)
Calcium: 9.5 mg/dL (ref 8.4–10.5)
Chloride: 107 meq/L (ref 96–112)
Creatinine, Ser: 0.69 mg/dL (ref 0.40–1.20)
GFR: 100.59 mL/min (ref >60.00)
Glucose, Bld: 79 mg/dL (ref 70–99)
Phosphorus: 2.9 mg/dL (ref 2.3–4.6)
Potassium: 3.3 meq/L — ABNORMAL LOW (ref 3.5–5.1)
Sodium: 141 meq/L (ref 135–145)

## 2023-05-19 LAB — HEMOGLOBIN A1C: Hgb A1c MFr Bld: 5.5 % (ref 4.6–6.5)

## 2023-05-19 MED ORDER — LINACLOTIDE 290 MCG PO CAPS: 290.0000 ug | ORAL_CAPSULE | Freq: Every day | ORAL | 3 refills | Status: DC

## 2023-05-19 MED ORDER — VALSARTAN 160 MG PO TABS: 160.0000 mg | ORAL_TABLET | Freq: Every day | ORAL | 3 refills | Status: DC

## 2023-05-19 MED ORDER — POTASSIUM CHLORIDE CRYS ER 20 MEQ PO TBCR
20.0000 meq | EXTENDED_RELEASE_TABLET | Freq: Every day | ORAL | 1 refills | Status: DC
Start: 2023-05-19 — End: 2023-11-11

## 2023-05-19 MED ORDER — AMLODIPINE BESYLATE 10 MG PO TABS: ORAL_TABLET | ORAL | 3 refills | Status: DC

## 2023-05-19 NOTE — Assessment & Plan Note (Signed)
Repeat hgbA1c 

## 2023-05-19 NOTE — Assessment & Plan Note (Signed)
Improved constipation with linzess Med refill sent

## 2023-05-19 NOTE — Assessment & Plan Note (Addendum)
Has not taken valsartan x 3months. States she thought she had no refill. Current use of amlodipine 10mg  daily Does not check BP at home. BP Readings from Last 3 Encounters:  05/19/23 134/84  09/16/22 (!) 159/90  08/19/22 120/82    Advised to maintain valsartan 160mg  and amlodipine 10mg  dose, monitor BP at home and send readings via mychart in 2weeks Med refills sent Check BMP F/up 6months

## 2023-05-19 NOTE — Progress Notes (Signed)
Established Patient Visit  Patient: Jane Hampton   DOB: 1971-01-14   53 y.o. Female  MRN: [id number] Visit Date: 05/19/2023  Subjective:    Chief Complaint  Patient presents with   Hypertension    Flu and shingles vaccine is due    Essential hypertension Has not taken valsartan x 3months. States she thought she had no refill. Current use of amlodipine 10mg  daily Does not check BP at home. BP Readings from Last 3 Encounters:  05/19/23 134/84  09/16/22 (!) 159/90  08/19/22 120/82    Advised to maintain valsartan 160mg  and amlodipine 10mg  dose, monitor BP at home and send readings via mychart in 2weeks Med refills sent Check BMP F/up 6months  Chronic idiopathic constipation Improved constipation with linzess Med refill sent   Hyperglycemia Repeat hgbA1c  Reviewed medical, surgical, and social history today  Medications: Outpatient Medications Prior to Visit  Medication Sig   cyclobenzaprine (FLEXERIL) 5 MG tablet Take 1 tablet (5 mg total) by mouth at bedtime.   potassium chloride SA (KLOR-CON M) 20 MEQ tablet Take 1 tablet (20 mEq total) by mouth daily.   witch hazel-glycerin (TUCKS) pad Apply 1 Application topically as needed for itching.   [DISCONTINUED] amLODipine (NORVASC) 10 MG tablet TAKE 1 TABLET BY MOUTH EVERYDAY AT BEDTIME   [DISCONTINUED] linaclotide (LINZESS) 290 MCG CAPS capsule Take 1 capsule (290 mcg total) by mouth daily before breakfast.   [DISCONTINUED] valsartan (DIOVAN) 160 MG tablet Take 1 tablet (160 mg total) by mouth daily.   No facility-administered medications prior to visit.   Reviewed past medical and social history.   ROS per HPI above      Objective:  BP 134/84 (BP Location: Right Arm, Patient Position: Sitting, Cuff Size: Large)   Pulse 70   Temp 97.7 F (36.5 C) (Temporal)   Resp 18   Wt 167 lb (75.8 kg)   LMP 05/19/2023 (Exact Date)   SpO2 99%   BMI 30.54 kg/m      Physical Exam Vitals and  nursing note reviewed.  Cardiovascular:     Rate and Rhythm: Normal rate and regular rhythm.     Pulses: Normal pulses.     Heart sounds: Normal heart sounds.  Pulmonary:     Effort: Pulmonary effort is normal.     Breath sounds: Normal breath sounds.  Musculoskeletal:     Right lower leg: No edema.     Left lower leg: No edema.  Neurological:     Mental Status: She is alert and oriented to person, place, and time.     No results found for any visits on 05/19/23.    Assessment & Plan:    Problem List Items Addressed This Visit     Chronic idiopathic constipation   Improved constipation with linzess Med refill sent       Relevant Medications   linaclotide (LINZESS) 290 MCG CAPS capsule   Essential hypertension - Primary   Has not taken valsartan x 3months. States she thought she had no refill. Current use of amlodipine 10mg  daily Does not check BP at home. BP Readings from Last 3 Encounters:  05/19/23 134/84  09/16/22 (!) 159/90  08/19/22 120/82    Advised to maintain valsartan 160mg  and amlodipine 10mg  dose, monitor BP at home and send readings via mychart in 2weeks Med refills sent Check BMP F/up 6months      Relevant Medications  valsartan (DIOVAN) 160 MG tablet   amLODipine (NORVASC) 10 MG tablet   Other Relevant Orders   Renal Function Panel   Hyperglycemia   Repeat hgbA1c      Relevant Orders   Hemoglobin A1c   Other Visit Diagnoses       Immunization due       Relevant Orders   Flu vaccine trivalent PF, 6mos and older(Flulaval,Afluria,Fluarix,Fluzone) (Completed)   Zoster Recombinant (Shingrix ) (Completed)      Return in about 6 months (around 11/16/2023) for CPE (fasting).     Alysia Penna, NP

## 2023-05-19 NOTE — Patient Instructions (Addendum)
Go to lab Maintain Heart healthy diet and daily exercise. Maintain current medications. Monitor BP 3x/week when you wake up Send BP readings via mychart in 2-4weeks

## 2023-08-12 ENCOUNTER — Other Ambulatory Visit: Payer: Self-pay | Admitting: Nurse Practitioner

## 2023-08-12 DIAGNOSIS — Z0000 Encounter for general adult medical examination without abnormal findings: Secondary | ICD-10-CM

## 2023-08-13 ENCOUNTER — Ambulatory Visit
Admission: RE | Admit: 2023-08-13 | Discharge: 2023-08-13 | Disposition: A | Discharge status: Home / Self Care | Source: Ambulatory Visit | Attending: Nurse Practitioner | Admitting: Nurse Practitioner

## 2023-08-13 DIAGNOSIS — Z1231 Encounter for screening mammogram for malignant neoplasm of breast: Secondary | ICD-10-CM | POA: Diagnosis not present

## 2023-08-13 DIAGNOSIS — Z0000 Encounter for general adult medical examination without abnormal findings: Secondary | ICD-10-CM

## 2023-10-08 ENCOUNTER — Other Ambulatory Visit: Payer: Self-pay

## 2023-10-08 ENCOUNTER — Emergency Department (HOSPITAL_COMMUNITY)
Admission: EM | Admit: 2023-10-08 | Discharge: 2023-10-08 | Disposition: A | Discharge status: Home / Self Care | Payer: Self-pay | Attending: Emergency Medicine | Admitting: Emergency Medicine

## 2023-10-08 ENCOUNTER — Emergency Department (HOSPITAL_COMMUNITY): Payer: Self-pay

## 2023-10-08 ENCOUNTER — Encounter (HOSPITAL_COMMUNITY): Payer: Self-pay | Admitting: *Deleted

## 2023-10-08 DIAGNOSIS — M545 Low back pain, unspecified: Secondary | ICD-10-CM | POA: Insufficient documentation

## 2023-10-08 DIAGNOSIS — M25511 Pain in right shoulder: Secondary | ICD-10-CM | POA: Insufficient documentation

## 2023-10-08 DIAGNOSIS — I10 Essential (primary) hypertension: Secondary | ICD-10-CM | POA: Insufficient documentation

## 2023-10-08 DIAGNOSIS — M25512 Pain in left shoulder: Secondary | ICD-10-CM | POA: Diagnosis not present

## 2023-10-08 DIAGNOSIS — Z79899 Other long term (current) drug therapy: Secondary | ICD-10-CM | POA: Diagnosis not present

## 2023-10-08 DIAGNOSIS — Y92410 Unspecified street and highway as the place of occurrence of the external cause: Secondary | ICD-10-CM | POA: Insufficient documentation

## 2023-10-08 MED ORDER — METHOCARBAMOL 500 MG PO TABS: 500.0000 mg | ORAL_TABLET | Freq: Two times a day (BID) | ORAL | 0 refills | Status: DC

## 2023-10-08 MED ORDER — LIDOCAINE 5 % EX PTCH: 1.0000 | MEDICATED_PATCH | CUTANEOUS | 0 refills | Status: DC

## 2023-10-08 MED ORDER — METHOCARBAMOL 500 MG PO TABS
500.0000 mg | ORAL_TABLET | Freq: Once | ORAL | Status: AC
  Administered 2023-10-08: 500 mg via ORAL
  Filled 2023-10-08: qty 1

## 2023-10-08 MED ORDER — LIDOCAINE 5 % EX PTCH
1.0000 | MEDICATED_PATCH | CUTANEOUS | Status: DC
  Administered 2023-10-08: 1 via TRANSDERMAL
  Filled 2023-10-08: qty 1
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
## 2023-10-11 ENCOUNTER — Inpatient Hospital Stay: Admitting: Family Medicine

## 2023-10-12 ENCOUNTER — Telehealth: Payer: Self-pay

## 2023-10-12 NOTE — Transitions of Care (Post Inpatient/ED Visit) (Signed)
   10/12/2023  Name: Alexa Blish MRN: [id number] DOB: 12/01/1970  Today's TOC FU Call Status: Today's TOC FU Call Status:: Successful TOC FU Call Completed TOC FU Call Complete Date: 10/12/23 Patient's Name and Date of Birth confirmed.  Transition Care Management Follow-up Telephone Call Date of Discharge: 10/08/23 Discharge Facility: [facility]) Type of Discharge: Emergency Department Reason for ED Visit: Other: How have you been since you were released from the hospital?: Same Any questions or concerns?: No  Items Reviewed: Did you receive and understand the discharge instructions provided?: No Medications obtained,verified, and reconciled?: Yes (Medications Reviewed) Any new allergies since your discharge?: No Dietary orders reviewed?: NA Do you have support at home?: Yes  Medications Reviewed Today: Medications Reviewed Today   Medications were not reviewed in this encounter     Home Care and Equipment/Supplies: Were Home Health Services Ordered?: NA Any new equipment or medical supplies ordered?: NA  Functional Questionnaire: Do you need assistance with bathing/showering or dressing?: No Do you need assistance with meal preparation?: No Do you need assistance with eating?: No Do you have difficulty maintaining continence: No Do you need assistance with getting out of bed/getting out of a chair/moving?: No Do you have difficulty managing or taking your medications?: No  Follow up appointments reviewed: PCP Follow-up appointment confirmed?: No MD Provider Line Number:[phone] Given: Yes Specialist Hospital Follow-up appointment confirmed?: NA Do you need transportation to your follow-up appointment?: No Do you understand care options if your condition(s) worsen?: Yes-patient verbalized understanding    SIGNATURE Ketura Sirek D, CMA

## 2023-10-14 ENCOUNTER — Ambulatory Visit
Admission: RE | Admit: 2023-10-14 | Discharge: 2023-10-14 | Disposition: A | Discharge status: Home / Self Care | Source: Ambulatory Visit | Attending: Family Medicine | Admitting: Family Medicine

## 2023-10-14 ENCOUNTER — Ambulatory Visit (INDEPENDENT_AMBULATORY_CARE_PROVIDER_SITE_OTHER): Payer: Self-pay | Admitting: Family Medicine

## 2023-10-14 ENCOUNTER — Encounter: Payer: Self-pay | Admitting: Family Medicine

## 2023-10-14 VITALS — BP 128/70 | HR 68 | Temp 97.4°F | Ht 62.0 in | Wt 160.4 lb

## 2023-10-14 DIAGNOSIS — S161XXA Strain of muscle, fascia and tendon at neck level, initial encounter: Secondary | ICD-10-CM | POA: Insufficient documentation

## 2023-10-14 DIAGNOSIS — S39012A Strain of muscle, fascia and tendon of lower back, initial encounter: Secondary | ICD-10-CM | POA: Diagnosis not present

## 2023-10-14 MED ORDER — MELOXICAM 7.5 MG PO TABS: 7.5000 mg | ORAL_TABLET | Freq: Every day | ORAL | 0 refills | Status: DC

## 2023-10-14 MED ORDER — METHOCARBAMOL 500 MG PO TABS: 500.0000 mg | ORAL_TABLET | Freq: Three times a day (TID) | ORAL | 0 refills | Status: DC | PRN
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
## 2023-10-19 ENCOUNTER — Encounter: Payer: Self-pay | Admitting: Nurse Practitioner

## 2023-10-25 ENCOUNTER — Ambulatory Visit: Payer: Self-pay | Admitting: Family Medicine

## 2023-11-04 ENCOUNTER — Ambulatory Visit: Admitting: Physical Therapy

## 2023-11-11 ENCOUNTER — Other Ambulatory Visit: Payer: Self-pay | Admitting: Nurse Practitioner

## 2023-11-11 DIAGNOSIS — E876 Hypokalemia: Secondary | ICD-10-CM

## 2023-11-12 ENCOUNTER — Other Ambulatory Visit: Payer: Self-pay | Admitting: Family Medicine

## 2023-11-12 DIAGNOSIS — S161XXA Strain of muscle, fascia and tendon at neck level, initial encounter: Secondary | ICD-10-CM

## 2023-11-12 DIAGNOSIS — S39012A Strain of muscle, fascia and tendon of lower back, initial encounter: Secondary | ICD-10-CM

## 2023-11-17 ENCOUNTER — Ambulatory Visit: Payer: Medicaid Other | Admitting: Nurse Practitioner

## 2023-11-17 VITALS — BP 110/70 | HR 72 | Temp 97.7°F | Ht 62.0 in | Wt 160.8 lb

## 2023-11-17 DIAGNOSIS — I10 Essential (primary) hypertension: Secondary | ICD-10-CM | POA: Diagnosis not present

## 2023-11-17 DIAGNOSIS — N951 Menopausal and female climacteric states: Secondary | ICD-10-CM | POA: Diagnosis not present

## 2023-11-17 DIAGNOSIS — Z136 Encounter for screening for cardiovascular disorders: Secondary | ICD-10-CM

## 2023-11-17 DIAGNOSIS — Z0001 Encounter for general adult medical examination with abnormal findings: Secondary | ICD-10-CM

## 2023-11-17 DIAGNOSIS — Z13220 Encounter for screening for lipoid disorders: Secondary | ICD-10-CM | POA: Diagnosis not present

## 2023-11-17 DIAGNOSIS — D500 Iron deficiency anemia secondary to blood loss (chronic): Secondary | ICD-10-CM | POA: Diagnosis not present

## 2023-11-17 LAB — COMPREHENSIVE METABOLIC PANEL WITH GFR
ALT: 18 U/L (ref 0–35)
AST: 22 U/L (ref 0–37)
Albumin: 4 g/dL (ref 3.5–5.2)
Alkaline Phosphatase: 62 U/L (ref 39–117)
BUN: 15 mg/dL (ref 6–23)
CO2: 26 meq/L (ref 19–32)
Calcium: 9.6 mg/dL (ref 8.4–10.5)
Chloride: 106 meq/L (ref 96–112)
Creatinine, Ser: 0.82 mg/dL (ref 0.40–1.20)
GFR: 82.62 mL/min (ref >60.00)
Glucose, Bld: 70 mg/dL (ref 70–99)
Potassium: 3.5 meq/L (ref 3.5–5.1)
Sodium: 138 meq/L (ref 135–145)
Total Bilirubin: 0.2 mg/dL (ref 0.2–1.2)
Total Protein: 6.7 g/dL (ref 6.0–8.3)

## 2023-11-17 LAB — CBC WITH DIFFERENTIAL/PLATELET
Basophils Absolute: 0 K/uL (ref 0.0–0.1)
Basophils Relative: 0.8 % (ref 0.0–3.0)
Eosinophils Absolute: 0.2 K/uL (ref 0.0–0.7)
Eosinophils Relative: 6.2 % — ABNORMAL HIGH (ref 0.0–5.0)
HCT: 29.1 % — ABNORMAL LOW (ref 36.0–46.0)
Hemoglobin: 9.5 g/dL — ABNORMAL LOW (ref 12.0–15.0)
Lymphocytes Relative: 25.8 % (ref 12.0–46.0)
Lymphs Abs: 1 K/uL (ref 0.7–4.0)
MCHC: 32.6 g/dL (ref 30.0–36.0)
MCV: 83.9 fl (ref 78.0–100.0)
Monocytes Absolute: 0.4 K/uL (ref 0.1–1.0)
Monocytes Relative: 9.6 % (ref 3.0–12.0)
Neutro Abs: 2.3 K/uL (ref 1.4–7.7)
Neutrophils Relative %: 57.6 % (ref 43.0–77.0)
Platelets: 337 K/uL (ref 150.0–400.0)
RBC: 3.47 Mil/uL — ABNORMAL LOW (ref 3.87–5.11)
RDW: 14.3 % (ref 11.5–15.5)
WBC: 3.9 K/uL — ABNORMAL LOW (ref 4.0–10.5)

## 2023-11-17 LAB — LIPID PANEL
Cholesterol: 166 mg/dL (ref 0–200)
HDL: 79.7 mg/dL (ref >39.00)
LDL Cholesterol: 77 mg/dL (ref 0–99)
NonHDL: 86.76
Total CHOL/HDL Ratio: 2
Triglycerides: 48 mg/dL (ref 0.0–149.0)
VLDL: 9.6 mg/dL (ref 0.0–40.0)

## 2023-11-17 LAB — TSH: TSH: 0.24 u[IU]/mL — ABNORMAL LOW (ref 0.35–5.50)

## 2023-11-17 LAB — IRON,TIBC AND FERRITIN PANEL
%SAT: 3 % — ABNORMAL LOW (ref 16–45)
Ferritin: 5 ng/mL — ABNORMAL LOW (ref 16–232)
Iron: 11 ug/dL — ABNORMAL LOW (ref 45–160)
TIBC: 354 ug/dL (ref 250–450)

## 2023-11-17 NOTE — Progress Notes (Signed)
 Complete physical exam  Patient: Jane Hampton   DOB: 05-24-1970   53 y.o. Female  MRN: [id number] Visit Date: 11/17/2023  Subjective:    Chief Complaint  Patient presents with   Annual Exam   Jane Hampton is a 53 y.o. female who presents today for a complete physical exam. She reports consuming a general diet. Walking daily She generally feels well. She reports sleeping well. She does have additional problems to discuss today.  Vision:No Dental:No STD Screen:No  BP Readings from Last 3 Encounters:  11/17/23 110/70  10/14/23 128/70  10/08/23 (!) 144/79   Wt Readings from Last 3 Encounters:  11/17/23 160 lb 12.8 oz (72.9 kg)  10/14/23 160 lb 6.4 oz (72.8 kg)  10/08/23 155 lb (70.3 kg)    Most recent fall risk assessment:    11/17/2023    9:09 AM  Fall Risk   Falls in the past year? 0  Injury with Fall? 0  Risk for fall due to : No Fall Risks  Follow up Falls evaluation completed    Depression screen:Yes - No Depression Most recent depression screenings:    11/17/2023    9:09 AM 05/19/2023    9:48 AM  PHQ 2/9 Scores  PHQ - 2 Score 0 0  PHQ- 9 Score 0     HPI  Perimenopause  >>ASSESSMENT AND PLAN FOR PMDD (PREMENSTRUAL DYSPHORIC DISORDER) WRITTEN ON 11/17/2023  3:04 PM BY Kam Kushnir LUM, NP  Reports worsening Hot flashes, irregular menstrual cycle, night sweats, mood swings x1year. S/p tubal ligation LMP 2months ago No GU or vaginal symptoms at this time Declined HRT, SSRI and veozah She opted to use OVER THE COUNTER estroven with ashwaghanda supplement  Past Medical History:  Diagnosis Date   Bleeding external hemorrhoids 01/06/2022   Hematochezia 09/06/2015   Hypertension    Migraines    Past Surgical History:  Procedure Laterality Date   CESAREAN SECTION     TONSILLECTOMY     TUBAL LIGATION     Social History   Socioeconomic History   Marital status: Married    Spouse name: Not on file   Number of children: 3   Years of  education: Not on file   Highest education level: Master's degree (e.g., MA, MS, MEng, MEd, MSW, MBA)  Occupational History   Occupation: Education administrator  Tobacco Use   Smoking status: Former    Current packs/day: 0.00    Types: Cigarettes    Start date: 10/02/1985    Quit date: 10/02/2000    Years since quitting: 23.1   Smokeless tobacco: Never  Vaping Use   Vaping status: Never Used  Substance and Sexual Activity   Alcohol use: No   Drug use: No   Sexual activity: Yes    Birth control/protection: Surgical, None    Comment: S/p tubal ligation  Other Topics Concern   Not on file  Social History Narrative   Not on file   Social Drivers of Health   Financial Resource Strain: Low Risk  (11/17/2023)   Overall Financial Resource Strain (CARDIA)    Difficulty of Paying Living Expenses: Not hard at all  Food Insecurity: No Food Insecurity (11/17/2023)   Hunger Vital Sign    Worried About Running Out of Food in the Last Year: Never true    Ran Out of Food in the Last Year: Never true  Transportation Needs: No Transportation Needs (11/17/2023)   PRAPARE - Administrator, Civil Service (Medical):  No    Lack of Transportation (Non-Medical): No  Physical Activity: Sufficiently Active (11/17/2023)   Exercise Vital Sign    Days of Exercise per Week: 7 days    Minutes of Exercise per Session: 30 min  Stress: No Stress Concern Present (11/17/2023)   [employer] of Occupational Health - Occupational Stress Questionnaire    Feeling of Stress: Not at all  Social Connections: Socially Integrated (11/17/2023)   Social Connection and Isolation Panel    Frequency of Communication with Friends and Family: Three times a week    Frequency of Social Gatherings with Friends and Family: Twice a week    Attends Religious Services: More than 4 times per year    Active Member of [employer] or Organizations: Yes    Attends Engineer, structural: More than 4 times per year     Marital Status: Married  Catering manager Violence: Not At Risk (08/19/2022)   Humiliation, Afraid, Rape, and Kick questionnaire    Fear of Current or Ex-Partner: No    Emotionally Abused: No    Physically Abused: No    Sexually Abused: No   Family Status  Relation Name Status   Mother Jupiter Boys (Not Specified)   Father amiaya mcneeley (Not Specified)   Sister  (Not Specified)   [employer]GM Fish farm manager (Not Specified)   Neg Hx  (Not Specified)  No partnership data on file   Family History  Problem Relation Age of Onset   Hypertension Mother    Heart disease Mother    Diabetes Mother    Hypertension Father    Anuerysm Father        brain   Anuerysm Sister        brain   Diabetes Maternal Grandmother    Breast cancer Neg Hx    Allergies  Allergen Reactions   Lisinopril  Swelling    Severe upper lip swelling    Patient Care Team: Lorrine Killilea, Roselie Rockford, NP as PCP - General (Internal Medicine)   Medications: Outpatient Medications Prior to Visit  Medication Sig   amLODipine  (NORVASC ) 10 MG tablet TAKE 1 TABLET BY MOUTH EVERYDAY AT BEDTIME   cyclobenzaprine  (FLEXERIL ) 5 MG tablet Take 1 tablet (5 mg total) by mouth at bedtime.   linaclotide  (LINZESS ) 290 MCG CAPS capsule Take 1 capsule (290 mcg total) by mouth daily before breakfast.   potassium chloride  SA (KLOR-CON  M) 20 MEQ tablet TAKE 1 TABLET BY MOUTH EVERY DAY   valsartan  (DIOVAN ) 160 MG tablet Take 1 tablet (160 mg total) by mouth daily.   witch hazel-glycerin  (TUCKS) pad Apply 1 Application topically as needed for itching.   [DISCONTINUED] lidocaine  (LIDODERM ) 5 % Place 1 patch onto the skin daily. Remove & Discard patch within 12 hours or as directed by MD   [DISCONTINUED] meloxicam  (MOBIC ) 7.5 MG tablet TAKE 1 TABLET BY MOUTH EVERY DAY   [DISCONTINUED] methocarbamol  (ROBAXIN ) 500 MG tablet Take 1 tablet (500 mg total) by mouth 2 (two) times daily.   [DISCONTINUED] methocarbamol  (ROBAXIN ) 500 MG tablet Take 1 tablet  (500 mg total) by mouth every 8 (eight) hours as needed.   No facility-administered medications prior to visit.    Review of Systems  Constitutional:  Negative for activity change, appetite change and unexpected weight change.  Respiratory: Negative.    Cardiovascular: Negative.   Gastrointestinal: Negative.   Endocrine: Negative for cold intolerance and heat intolerance.  Genitourinary: Negative.   Musculoskeletal: Negative.   Skin: Negative.   Neurological: Negative.  Hematological: Negative.   Psychiatric/Behavioral:  Negative for behavioral problems, decreased concentration, dysphoric mood, hallucinations, self-injury, sleep disturbance and suicidal ideas. The patient is not nervous/anxious.         Objective:  BP 110/70 (BP Location: Left Arm, Patient Position: Sitting, Cuff Size: Normal)   Pulse 72   Temp 97.7 F (36.5 C) (Oral)   Ht 5' 2 (1.575 m)   Wt 160 lb 12.8 oz (72.9 kg)   BMI 29.41 kg/m     Physical Exam Vitals and nursing note reviewed.  Constitutional:      General: She is not in acute distress. HENT:     Right Ear: Tympanic membrane, ear canal and external ear normal.     Left Ear: Tympanic membrane, ear canal and external ear normal.     Nose: Nose normal.  Eyes:     Extraocular Movements: Extraocular movements intact.     Conjunctiva/sclera: Conjunctivae normal.     Pupils: Pupils are equal, round, and reactive to light.  Neck:     Thyroid : No thyroid  mass, thyromegaly or thyroid  tenderness.  Cardiovascular:     Rate and Rhythm: Normal rate and regular rhythm.     Pulses: Normal pulses.     Heart sounds: Normal heart sounds.  Pulmonary:     Effort: Pulmonary effort is normal.     Breath sounds: Normal breath sounds.  Abdominal:     General: Bowel sounds are normal.     Palpations: Abdomen is soft.  Musculoskeletal:        General: Normal range of motion.     Cervical back: Normal range of motion and neck supple.     Right lower leg: No  edema.     Left lower leg: No edema.  Lymphadenopathy:     Cervical: No cervical adenopathy.  Skin:    General: Skin is warm and dry.  Neurological:     Mental Status: She is alert and oriented to person, place, and time.     Cranial Nerves: No cranial nerve deficit.  Psychiatric:        Mood and Affect: Mood normal.        Behavior: Behavior normal.        Thought Content: Thought content normal.      Results for orders placed or performed in visit on 11/17/23  Comprehensive metabolic panel with GFR  Result Value Ref Range   Sodium 138 135 - 145 mEq/L   Potassium 3.5 3.5 - 5.1 mEq/L   Chloride 106 96 - 112 mEq/L   CO2 26 19 - 32 mEq/L   Glucose, Bld 70 70 - 99 mg/dL   BUN 15 6 - 23 mg/dL   Creatinine, Ser 9.17 0.40 - 1.20 mg/dL   Total Bilirubin 0.2 0.2 - 1.2 mg/dL   Alkaline Phosphatase 62 39 - 117 U/L   AST 22 0 - 37 U/L   ALT 18 0 - 35 U/L   Total Protein 6.7 6.0 - 8.3 g/dL   Albumin 4.0 3.5 - 5.2 g/dL   GFR 17.37 >39.99 mL/min   Calcium 9.6 8.4 - 10.5 mg/dL  Lipid panel  Result Value Ref Range   Cholesterol 166 0 - 200 mg/dL   Triglycerides 51.9 0.0 - 149.0 mg/dL   HDL 20.29 >60.99 mg/dL   VLDL 9.6 0.0 - 59.9 mg/dL   LDL Cholesterol 77 0 - 99 mg/dL   Total CHOL/HDL Ratio 2    NonHDL 86.76   CBC with Differential/Platelet  Result Value Ref Range   WBC 3.9 (L) 4.0 - 10.5 K/uL   RBC 3.47 (L) 3.87 - 5.11 Mil/uL   Hemoglobin 9.5 (L) 12.0 - 15.0 g/dL   HCT 70.8 (L) 63.9 - 53.9 %   MCV 83.9 78.0 - 100.0 fl   MCHC 32.6 30.0 - 36.0 g/dL   RDW 85.6 88.4 - 84.4 %   Platelets 337.0 150.0 - 400.0 K/uL   Neutrophils Relative % 57.6 43.0 - 77.0 %   Lymphocytes Relative 25.8 12.0 - 46.0 %   Monocytes Relative 9.6 3.0 - 12.0 %   Eosinophils Relative 6.2 (H) 0.0 - 5.0 %   Basophils Relative 0.8 0.0 - 3.0 %   Neutro Abs 2.3 1.4 - 7.7 K/uL   Lymphs Abs 1.0 0.7 - 4.0 K/uL   Monocytes Absolute 0.4 0.1 - 1.0 K/uL   Eosinophils Absolute 0.2 0.0 - 0.7 K/uL   Basophils  Absolute 0.0 0.0 - 0.1 K/uL  TSH  Result Value Ref Range   TSH 0.24 (L) 0.35 - 5.50 uIU/mL      Assessment & Plan:    Routine Health Maintenance and Physical Exam  Immunization History  Administered Date(s) Administered   Dtap, Unspecified 12/21/1974, 10/22/1977   Hpv-Unspecified 01/06/2022   Influenza, Seasonal, Injecte, Preservative Fre 05/19/2023   Influenza,inj,Quad PF,6+ Mos 01/10/2016, 01/05/2017, 02/28/2019, 01/27/2021, 01/06/2022   Influenza-Unspecified 02/21/2020, 01/25/2021   Measles 12/25/1974   PFIZER(Purple Top)SARS-COV-2 Vaccination 07/19/2019, 08/09/2019, 03/28/2020   PPD Test 05/22/2015, 06/17/2016   Polio, Unspecified 12/21/1974, 12/25/1974, 10/22/1977   Rubella 12/25/1974   Tdap 04/24/2014   Zoster Recombinant(Shingrix ) 08/19/2022, 05/19/2023   Health Maintenance  Topic Date Due   Hepatitis B Vaccines (1 of 3 - 19+ 3-dose series) Never done   COVID-19 Vaccine (4 - 2024-25 season) 12/27/2022   INFLUENZA VACCINE  11/26/2023   DTaP/Tdap/Td (4 - Td or Tdap) 04/24/2024   Cervical Cancer Screening (HPV/Pap Cotest)  03/07/2025   MAMMOGRAM  08/12/2025   Colonoscopy  10/16/2025   HIV Screening  Completed   Zoster Vaccines- Shingrix   Completed   HPV VACCINES  Aged Out   Meningococcal B Vaccine  Aged Out   Hepatitis C Screening  Discontinued   Discussed health benefits of physical activity, and encouraged her to engage in regular exercise appropriate for her age and condition.  Problem List Items Addressed This Visit     Essential hypertension   Iron deficiency anemia due to chronic blood loss   Relevant Orders   Iron, TIBC and Ferritin Panel   CBC with Differential/Platelet (Completed)   Perimenopause    >>ASSESSMENT AND PLAN FOR PMDD (PREMENSTRUAL DYSPHORIC DISORDER) WRITTEN ON 11/17/2023  3:04 PM BY Willis Kuipers LUM, NP  Reports worsening Hot flashes, irregular menstrual cycle, night sweats, mood swings x1year. S/p tubal ligation LMP 2months ago No  GU or vaginal symptoms at this time Declined HRT, SSRI and veozah She opted to use OVER THE COUNTER estroven with ashwaghanda supplement      Other Visit Diagnoses       Encounter for preventative adult health care exam with abnormal findings    -  Primary   Relevant Orders   Comprehensive metabolic panel with GFR (Completed)   Lipid panel (Completed)   TSH (Completed)     Encounter for lipid screening for cardiovascular disease       Relevant Orders   Lipid panel (Completed)      Return in about 6 months (around 05/19/2024) for HTN.  Roselie Mood, NP

## 2023-11-17 NOTE — Assessment & Plan Note (Signed)
Repeat cbc and iron panel

## 2023-11-17 NOTE — Assessment & Plan Note (Signed)
>>  ASSESSMENT AND PLAN FOR PMDD (PREMENSTRUAL DYSPHORIC DISORDER) WRITTEN ON 11/17/2023  3:04 PM BY Myrtha Tonkovich LUM, NP  Reports worsening Hot flashes, irregular menstrual cycle, night sweats, mood swings x1year. S/p tubal ligation LMP 2months ago No GU or vaginal symptoms at this time Declined HRT, SSRI and veozah She opted to use OVER THE COUNTER estroven with ashwaghanda supplement

## 2023-11-17 NOTE — Patient Instructions (Addendum)
 [facility] general surgery, [phone] [address], [address] [place], [place] [address]  Can use Estroven with ashwaghanda for perimenopause symptoms.  Go to lab Maintain Heart healthy diet and daily exercise. Maintain current medications.

## 2023-11-17 NOTE — Assessment & Plan Note (Signed)
 BP at goal with amlodipine  and valsartan  BP Readings from Last 3 Encounters:  11/17/23 110/70  10/14/23 128/70  10/08/23 (!) 144/79    Maintain med doses Repeat CMP F/up in 3-84months

## 2023-11-17 NOTE — Assessment & Plan Note (Addendum)
 Reports worsening Hot flashes, irregular menstrual cycle, night sweats, mood swings x1year. S/p tubal ligation LMP 2months ago No GU or vaginal symptoms at this time Declined HRT, SSRI and veozah She opted to use OVER THE COUNTER estroven with ashwaghanda supplement

## 2023-11-18 ENCOUNTER — Ambulatory Visit: Payer: Self-pay | Admitting: Nurse Practitioner

## 2023-11-18 DIAGNOSIS — R7989 Other specified abnormal findings of blood chemistry: Secondary | ICD-10-CM

## 2023-11-18 DIAGNOSIS — D500 Iron deficiency anemia secondary to blood loss (chronic): Secondary | ICD-10-CM

## 2023-12-20 ENCOUNTER — Other Ambulatory Visit (INDEPENDENT_AMBULATORY_CARE_PROVIDER_SITE_OTHER)

## 2023-12-20 DIAGNOSIS — R7989 Other specified abnormal findings of blood chemistry: Secondary | ICD-10-CM | POA: Diagnosis not present

## 2023-12-20 DIAGNOSIS — D500 Iron deficiency anemia secondary to blood loss (chronic): Secondary | ICD-10-CM

## 2023-12-20 LAB — IRON,TIBC AND FERRITIN PANEL
%SAT: 39 % (ref 16–45)
Ferritin: 23 ng/mL (ref 16–232)
Iron: 128 ug/dL (ref 45–160)
TIBC: 327 ug/dL (ref 250–450)

## 2023-12-20 LAB — THYROID PANEL WITH TSH
Free Thyroxine Index: 1.5 (ref 1.4–3.8)
T3 Uptake: 28 % (ref 22–35)
T4, Total: 5.5 ug/dL (ref 5.1–11.9)
TSH: 0.31 m[IU]/L — ABNORMAL LOW

## 2023-12-21 ENCOUNTER — Ambulatory Visit: Payer: Self-pay | Admitting: Nurse Practitioner

## 2023-12-21 DIAGNOSIS — E0590 Thyrotoxicosis, unspecified without thyrotoxic crisis or storm: Secondary | ICD-10-CM

## 2023-12-23 ENCOUNTER — Inpatient Hospital Stay
Admission: RE | Admit: 2023-12-23 | Discharge: 2023-12-23 | Discharge status: Home / Self Care | Source: Ambulatory Visit | Attending: Nurse Practitioner

## 2023-12-23 DIAGNOSIS — E0590 Thyrotoxicosis, unspecified without thyrotoxic crisis or storm: Secondary | ICD-10-CM

## 2023-12-30 ENCOUNTER — Ambulatory Visit: Payer: Self-pay | Admitting: Nurse Practitioner

## 2023-12-30 DIAGNOSIS — E042 Nontoxic multinodular goiter: Secondary | ICD-10-CM

## 2023-12-30 DIAGNOSIS — E0590 Thyrotoxicosis, unspecified without thyrotoxic crisis or storm: Secondary | ICD-10-CM

## 2024-01-04 ENCOUNTER — Telehealth: Payer: Self-pay

## 2024-01-04 NOTE — Telephone Encounter (Signed)
 The patient has been notified of the recommendations about having a repeat ultrasound in 1 year and verbalized understanding.  All questions (if any) were answered.

## 2024-01-04 NOTE — Telephone Encounter (Signed)
 Called [facility] and spoke with Desire informing her that I was calling back because I received a message from [place] about questions. She looked up patients chart and informed me that it is recommended patient have a follow up ultrasound in 1 year per Dr. Karalee.

## 2024-01-04 NOTE — Telephone Encounter (Signed)
 Copied from CRM #[id number]. Topic: Referral - Question >> Jan 04, 2024  9:47 AM Deleta RAMAN wrote: Reason for CRM: brandy from [facility] questions about the order radiologist did not approve for biopsy and recommends follow up ultrasound in 1 year. Dr. Karalee >> Jan 04, 2024  9:51 AM Deleta RAMAN wrote: [phone] selection 1 scheduling followed by 5 for ultrasound

## 2024-03-24 ENCOUNTER — Other Ambulatory Visit: Payer: Self-pay | Admitting: Nurse Practitioner

## 2024-03-24 DIAGNOSIS — I10 Essential (primary) hypertension: Secondary | ICD-10-CM

## 2024-03-27 MED ORDER — AMLODIPINE BESYLATE 10 MG PO TABS: 10.0000 mg | ORAL_TABLET | Freq: Every day | ORAL | 1 refills | Status: DC

## 2024-03-27 NOTE — Addendum Note (Signed)
 Addended by: LENON ROUGHEN on: 03/27/2024 05:10 PM   Modules accepted: Orders

## 2024-03-29 NOTE — Telephone Encounter (Unsigned)
 Copied from CRM [id number]. Topic: Clinical - Medication Question >> Mar 29, 2024  2:50 PM Deaijah H wrote: Reason for CRM: Patient is needing medication amLODipine  (NORVASC ) 10 MG tablet, valsartan  (DIOVAN ) 160 MG tablet, and linaclotide  (LINZESS ) 290 MCG CAPS capsule sent to this fax number [phone] to [facility]. Please call once completed.

## 2024-04-12 ENCOUNTER — Other Ambulatory Visit: Payer: Self-pay | Admitting: Nurse Practitioner

## 2024-04-12 DIAGNOSIS — K5904 Chronic idiopathic constipation: Secondary | ICD-10-CM

## 2024-04-12 DIAGNOSIS — I10 Essential (primary) hypertension: Secondary | ICD-10-CM

## 2024-04-12 MED ORDER — AMLODIPINE BESYLATE 10 MG PO TABS: 10.0000 mg | ORAL_TABLET | Freq: Every day | ORAL | 1 refills | Status: DC

## 2024-04-12 MED ORDER — VALSARTAN 160 MG PO TABS: 160.0000 mg | ORAL_TABLET | Freq: Every day | ORAL | 1 refills | Status: DC

## 2024-04-12 MED ORDER — LINACLOTIDE 290 MCG PO CAPS: 290.0000 ug | ORAL_CAPSULE | Freq: Every day | ORAL | 1 refills | Status: AC

## 2024-04-12 NOTE — Telephone Encounter (Signed)
 Copied from CRM #[id number]. Topic: Clinical - Medication Refill >> Apr 12, 2024  4:14 PM Shereese L wrote: Medication: valsartan  (DIOVAN ) 160 MG tablet amLODipine  (NORVASC ) 10 MG tablet linaclotide  (LINZESS ) 290 MCG CAPS capsule  Has the patient contacted their pharmacy? Yes (Agent: If no, request that the patient contact the pharmacy for the refill. If patient does not wish to contact the pharmacy document the reason why and proceed with request.) (Agent: If yes, when and what did the pharmacy advise?)  This is the patient's preferred pharmacy:   [facility] - [place], [place] - [address] [place] [address] Phone: [phone] Fax: [phone]  Is this the correct pharmacy for this prescription? Yes If no, delete pharmacy and type the correct one.   Has the prescription been filled recently? Yes  Is the patient out of the medication? Yes  Has the patient been seen for an appointment in the last year OR does the patient have an upcoming appointment? Yes  Can we respond through MyChart? Yes  Agent: Please be advised that Rx refills may take up to 3 business days. We ask that you follow-up with your pharmacy.

## 2024-04-12 NOTE — Telephone Encounter (Unsigned)
 Copied from CRM #[id number]. Topic: Clinical - Medication Question >> Apr 12, 2024  4:20 PM Shereese L wrote: Reason for CRM: Patient stated that the [facility] fax # has changed and thr correct number is [phone]... Please resend script for all 3 medications listed below valsartan  (DIOVAN ) 160 MG tablet amLODipine  (NORVASC ) 10 MG tablet linaclotide  (LINZESS ) 290 MCG CAPS capsule

## 2024-04-24 ENCOUNTER — Other Ambulatory Visit: Payer: Self-pay | Admitting: Nurse Practitioner

## 2024-04-24 DIAGNOSIS — I10 Essential (primary) hypertension: Secondary | ICD-10-CM

## 2024-04-24 DIAGNOSIS — K5904 Chronic idiopathic constipation: Secondary | ICD-10-CM

## 2024-04-24 NOTE — Telephone Encounter (Signed)
 Copied from CRM #[id number]. Topic: Clinical - Medication Refill >> Apr 24, 2024  2:29 PM Suzen RAMAN wrote: Medication: amLODipine  (NORVASC ) 10 MG tablet valsartan  (DIOVAN ) 160 MG tablet linaclotide  (LINZESS ) 290 MCG CAPS capsule  Has the patient contacted their pharmacy? Yes   This is the patient's preferred pharmacy:   [id number] [place], [place] - [address] [place] [address] Phone: [phone] Fax: [phone]  Is this the correct pharmacy for this prescription? Yes If no, delete pharmacy and type the correct one.   Has the prescription been filled recently? Yes; 04/12/24 [facility] will cancel out of system  Is the patient out of the medication? Yes  Has the patient been seen for an appointment in the last year OR does the patient have an upcoming appointment? Yes  Can we respond through MyChart? Yes  Agent: Please be advised that Rx refills may take up to 3 business days. We ask that you follow-up with your pharmacy.

## 2024-05-23 ENCOUNTER — Ambulatory Visit: Admitting: Nurse Practitioner

## 2024-05-24 ENCOUNTER — Ambulatory Visit: Admitting: Nurse Practitioner

## 2024-05-24 VITALS — BP 122/74 | Temp 97.6°F | Ht 62.0 in | Wt 158.8 lb

## 2024-05-24 DIAGNOSIS — E042 Nontoxic multinodular goiter: Secondary | ICD-10-CM | POA: Diagnosis not present

## 2024-05-24 DIAGNOSIS — K5904 Chronic idiopathic constipation: Secondary | ICD-10-CM

## 2024-05-24 DIAGNOSIS — E0590 Thyrotoxicosis, unspecified without thyrotoxic crisis or storm: Secondary | ICD-10-CM | POA: Diagnosis not present

## 2024-05-24 DIAGNOSIS — Z23 Encounter for immunization: Secondary | ICD-10-CM | POA: Diagnosis not present

## 2024-05-24 DIAGNOSIS — I10 Essential (primary) hypertension: Secondary | ICD-10-CM | POA: Diagnosis not present

## 2024-05-24 DIAGNOSIS — K648 Other hemorrhoids: Secondary | ICD-10-CM | POA: Diagnosis not present

## 2024-05-24 MED ORDER — HYDROCORTISONE ACETATE 25 MG RE SUPP: 25.0000 mg | Freq: Two times a day (BID) | RECTAL | 0 refills | Status: AC

## 2024-05-24 MED ORDER — VALSARTAN 160 MG PO TABS: 160.0000 mg | ORAL_TABLET | Freq: Every day | ORAL | 1 refills | Status: AC

## 2024-05-24 MED ORDER — HYDROCORTISONE ACETATE 25 MG RE SUPP: 25.0000 mg | Freq: Two times a day (BID) | RECTAL | 0 refills | Status: DC

## 2024-05-24 MED ORDER — AMLODIPINE BESYLATE 10 MG PO TABS: 10.0000 mg | ORAL_TABLET | Freq: Every day | ORAL | 1 refills | Status: AC

## 2024-05-24 NOTE — Progress Notes (Signed)
 "               Established Patient Visit  Patient: Jane Hampton   DOB: 1970/06/25   54 y.o. Female  MRN: [id number] Visit Date: 05/24/2024  Subjective:    Chief Complaint  Patient presents with   Follow-up    6 month follow up for HTN  Referral for hemorrhoids  Wants Prevnar 20 and flu vaccine     HPI Essential hypertension BP at goal with amlodipine  and valsartan  BP Readings from Last 3 Encounters:  05/24/24 122/74  11/17/23 110/70  10/14/23 128/70    Maintain med doses. F/up in 6months  Internal and external prolapsed hemorrhoids Pain and intermittent bleeding due to constipation.  Reviewed medical, surgical, and social history today  Medications: Show/hide medication list[1] Reviewed past medical and social history.   ROS per HPI above      Objective:  BP 122/74 (BP Location: Right Arm, Patient Position: Sitting, Cuff Size: Normal)   Temp 97.6 F (36.4 C) (Oral)   Ht 5' 2 (1.575 m)   Wt 158 lb 12.8 oz (72 kg)   BMI 29.04 kg/m      Physical Exam Vitals and nursing note reviewed.  Cardiovascular:     Rate and Rhythm: Normal rate.     Pulses: Normal pulses.  Pulmonary:     Effort: Pulmonary effort is normal.  Genitourinary:    Rectum: Tenderness and external hemorrhoid present.  Neurological:     Mental Status: She is alert and oriented to person, place, and time.     No results found for any visits on 05/24/24.    Assessment & Plan:    Problem List Items Addressed This Visit     Chronic idiopathic constipation   Relevant Orders   AMB Referral VBCI Care Management   Essential hypertension - Primary   BP at goal with amlodipine  and valsartan  BP Readings from Last 3 Encounters:  05/24/24 122/74  11/17/23 110/70  10/14/23 128/70    Maintain med doses. F/up in 6months      Relevant Medications   amLODipine  (NORVASC ) 10 MG tablet   valsartan  (DIOVAN ) 160 MG tablet   Internal and external prolapsed hemorrhoids   Pain and  intermittent bleeding due to constipation.       Relevant Medications   amLODipine  (NORVASC ) 10 MG tablet   hydrocortisone  (ANUSOL -HC) 25 MG suppository   valsartan  (DIOVAN ) 160 MG tablet   Other Relevant Orders   Ambulatory referral to Gastroenterology   Multiple thyroid  nodules   Relevant Orders   TSH   T4, free   Thyroid  peroxidase antibody   Thyroid  stimulating immunoglobulin   Subclinical hyperthyroidism   Relevant Orders   TSH   T4, free   Thyroid  peroxidase antibody   Thyroid  stimulating immunoglobulin   Other Visit Diagnoses       Immunization due       Relevant Orders   Flu vaccine trivalent PF, 6mos and older(Flulaval,Afluria,Fluarix,Fluzone) (Completed)   Pneumococcal conjugate vaccine 20-valent (Prevnar 20) (Completed)      Return in about 6 months (around 11/21/2024) for CPE (fasting).     Roselie Mood, NP      [1]  Outpatient Medications Prior to Visit  Medication Sig   linaclotide  (LINZESS ) 290 MCG CAPS capsule Take 1 capsule (290 mcg total) by mouth daily before breakfast.   witch hazel-glycerin  (TUCKS) pad Apply 1 Application topically as needed for itching.   [DISCONTINUED] amLODipine  (NORVASC ) 10 MG tablet Take 1 tablet (10  mg total) by mouth daily.   [DISCONTINUED] cyclobenzaprine  (FLEXERIL ) 5 MG tablet Take 1 tablet (5 mg total) by mouth at bedtime.   [DISCONTINUED] potassium chloride  SA (KLOR-CON  M) 20 MEQ tablet TAKE 1 TABLET BY MOUTH EVERY DAY   [DISCONTINUED] valsartan  (DIOVAN ) 160 MG tablet Take 1 tablet (160 mg total) by mouth daily.   No facility-administered medications prior to visit.   "

## 2024-05-24 NOTE — Assessment & Plan Note (Signed)
 BP at goal with amlodipine  and valsartan  BP Readings from Last 3 Encounters:  05/24/24 122/74  11/17/23 110/70  10/14/23 128/70    Maintain med doses. F/up in 6months

## 2024-05-24 NOTE — Assessment & Plan Note (Signed)
 Pain and intermittent bleeding due to constipation.

## 2024-05-24 NOTE — Patient Instructions (Signed)
Go to lab Maintain current med doses 

## 2024-05-25 ENCOUNTER — Telehealth: Payer: Self-pay

## 2024-05-25 LAB — T4, FREE: Free T4: 0.55 ng/dL — ABNORMAL LOW (ref 0.60–1.60)

## 2024-05-25 LAB — TSH: TSH: 0.65 u[IU]/mL (ref 0.35–5.50)

## 2024-05-25 NOTE — Progress Notes (Signed)
 Complex Care Management Note Care Guide Note  05/25/2024 Name: Jane Hampton MRN: [id number] DOB: 1970-12-24   Complex Care Management Outreach Attempts: An unsuccessful telephone outreach was attempted today to offer the patient information about available complex care management services.  Follow Up Plan:  Additional outreach attempts will be made to offer the patient complex care management information and services.   Encounter Outcome:  No Answer  [facility]  [facility], [facility] VBCI Assistant Direct Dial: [phone]  Fax: [phone]

## 2024-05-27 LAB — THYROID STIMULATING IMMUNOGLOBULIN: TSI: <89 %{baseline}

## 2024-05-27 LAB — THYROID PEROXIDASE ANTIBODY: Thyroperoxidase Ab SerPl-aCnc: 1 [IU]/mL

## 2024-05-30 ENCOUNTER — Ambulatory Visit: Payer: Self-pay | Admitting: Nurse Practitioner

## 2024-06-14 ENCOUNTER — Other Ambulatory Visit

## 2024-11-21 ENCOUNTER — Encounter: Admitting: Nurse Practitioner
